# Patient Record
Sex: Female | Born: 1945 | Race: White | Hispanic: No | Marital: Married | Smoking: Never smoker
Health system: Southern US, Community
[De-identification: ages and names within clinical notes are randomized; demographics above are authoritative.]

## PROBLEM LIST (undated history)

## (undated) DIAGNOSIS — Z923 Personal history of irradiation: Secondary | ICD-10-CM

## (undated) DIAGNOSIS — M1990 Unspecified osteoarthritis, unspecified site: Secondary | ICD-10-CM

## (undated) DIAGNOSIS — I10 Essential (primary) hypertension: Secondary | ICD-10-CM

## (undated) DIAGNOSIS — A809 Acute poliomyelitis, unspecified: Secondary | ICD-10-CM

## (undated) DIAGNOSIS — G709 Myoneural disorder, unspecified: Secondary | ICD-10-CM

## (undated) DIAGNOSIS — E785 Hyperlipidemia, unspecified: Secondary | ICD-10-CM

## (undated) DIAGNOSIS — C801 Malignant (primary) neoplasm, unspecified: Secondary | ICD-10-CM

## (undated) HISTORY — PX: BREAST LUMPECTOMY: SHX2

## (undated) HISTORY — DX: Unspecified osteoarthritis, unspecified site: M19.90

## (undated) HISTORY — DX: Acute poliomyelitis, unspecified: A80.9

## (undated) HISTORY — DX: Malignant (primary) neoplasm, unspecified: C80.1

## (undated) HISTORY — DX: Myoneural disorder, unspecified: G70.9

## (undated) HISTORY — DX: Essential (primary) hypertension: I10

## (undated) HISTORY — DX: Hyperlipidemia, unspecified: E78.5

## (undated) HISTORY — PX: FOOT SURGERY: SHX648

---

## 1980-01-18 HISTORY — PX: BREAST BIOPSY: SHX20

## 1998-09-03 ENCOUNTER — Other Ambulatory Visit: Admission: RE | Admit: 1998-09-03 | Discharge: 1998-09-03 | Payer: Self-pay | Admitting: Obstetrics and Gynecology

## 1998-11-18 ENCOUNTER — Encounter: Admission: RE | Admit: 1998-11-18 | Discharge: 1998-11-18 | Payer: Self-pay | Admitting: Obstetrics and Gynecology

## 1998-11-18 ENCOUNTER — Encounter: Payer: Self-pay | Admitting: Obstetrics and Gynecology

## 1999-10-05 ENCOUNTER — Other Ambulatory Visit: Admission: RE | Admit: 1999-10-05 | Discharge: 1999-10-05 | Payer: Self-pay | Admitting: Obstetrics and Gynecology

## 1999-11-23 ENCOUNTER — Encounter: Admission: RE | Admit: 1999-11-23 | Discharge: 1999-11-23 | Payer: Self-pay | Admitting: Family Medicine

## 1999-11-23 ENCOUNTER — Encounter: Payer: Self-pay | Admitting: Family Medicine

## 2000-10-19 ENCOUNTER — Other Ambulatory Visit: Admission: RE | Admit: 2000-10-19 | Discharge: 2000-10-19 | Payer: Self-pay | Admitting: Internal Medicine

## 2000-12-01 ENCOUNTER — Encounter: Admission: RE | Admit: 2000-12-01 | Discharge: 2000-12-01 | Payer: Self-pay | Admitting: Family Medicine

## 2000-12-01 ENCOUNTER — Encounter: Payer: Self-pay | Admitting: Family Medicine

## 2002-01-03 ENCOUNTER — Encounter: Admission: RE | Admit: 2002-01-03 | Discharge: 2002-01-03 | Payer: Self-pay | Admitting: Family Medicine

## 2002-01-03 ENCOUNTER — Encounter: Payer: Self-pay | Admitting: Family Medicine

## 2002-06-21 ENCOUNTER — Ambulatory Visit (HOSPITAL_COMMUNITY): Admission: RE | Admit: 2002-06-21 | Discharge: 2002-06-21 | Payer: Self-pay | Admitting: Gastroenterology

## 2003-04-08 ENCOUNTER — Encounter: Admission: RE | Admit: 2003-04-08 | Discharge: 2003-04-08 | Payer: Self-pay | Admitting: Family Medicine

## 2004-04-27 ENCOUNTER — Encounter: Admission: RE | Admit: 2004-04-27 | Discharge: 2004-04-27 | Payer: Self-pay | Admitting: Obstetrics and Gynecology

## 2004-04-30 ENCOUNTER — Encounter: Admission: RE | Admit: 2004-04-30 | Discharge: 2004-04-30 | Payer: Self-pay | Admitting: Obstetrics and Gynecology

## 2004-07-07 ENCOUNTER — Emergency Department (HOSPITAL_COMMUNITY): Admission: EM | Admit: 2004-07-07 | Discharge: 2004-07-07 | Payer: Self-pay | Admitting: Emergency Medicine

## 2005-01-17 HISTORY — PX: BREAST SURGERY: SHX581

## 2005-07-05 ENCOUNTER — Encounter: Admission: RE | Admit: 2005-07-05 | Discharge: 2005-07-05 | Payer: Self-pay | Admitting: Family Medicine

## 2005-07-08 ENCOUNTER — Encounter (INDEPENDENT_AMBULATORY_CARE_PROVIDER_SITE_OTHER): Payer: Self-pay | Admitting: Specialist

## 2005-07-08 ENCOUNTER — Encounter: Admission: RE | Admit: 2005-07-08 | Discharge: 2005-07-08 | Payer: Self-pay | Admitting: Family Medicine

## 2005-07-08 ENCOUNTER — Encounter (INDEPENDENT_AMBULATORY_CARE_PROVIDER_SITE_OTHER): Payer: Self-pay | Admitting: Radiology

## 2005-07-17 ENCOUNTER — Encounter: Admission: RE | Admit: 2005-07-17 | Discharge: 2005-07-17 | Payer: Self-pay | Admitting: General Surgery

## 2005-08-02 ENCOUNTER — Encounter: Admission: RE | Admit: 2005-08-02 | Discharge: 2005-08-02 | Payer: Self-pay | Admitting: General Surgery

## 2005-08-03 ENCOUNTER — Ambulatory Visit (HOSPITAL_BASED_OUTPATIENT_CLINIC_OR_DEPARTMENT_OTHER): Admission: RE | Admit: 2005-08-03 | Discharge: 2005-08-03 | Payer: Self-pay | Admitting: General Surgery

## 2005-08-03 ENCOUNTER — Encounter (INDEPENDENT_AMBULATORY_CARE_PROVIDER_SITE_OTHER): Payer: Self-pay | Admitting: Specialist

## 2005-08-03 ENCOUNTER — Encounter: Admission: RE | Admit: 2005-08-03 | Discharge: 2005-08-03 | Payer: Self-pay | Admitting: General Surgery

## 2005-08-15 ENCOUNTER — Ambulatory Visit: Payer: Self-pay | Admitting: Oncology

## 2005-08-24 LAB — CBC WITH DIFFERENTIAL/PLATELET
Eosinophils Absolute: 0.3 10*3/uL (ref 0.0–0.5)
HCT: 41.1 % (ref 34.8–46.6)
HGB: 14.2 g/dL (ref 11.6–15.9)
LYMPH%: 27.5 % (ref 14.0–48.0)
MONO#: 0.6 10*3/uL (ref 0.1–0.9)
NEUT#: 2.7 10*3/uL (ref 1.5–6.5)
NEUT%: 54.7 % (ref 39.6–76.8)
Platelets: 335 10*3/uL (ref 145–400)
WBC: 5 10*3/uL (ref 3.9–10.0)

## 2005-08-24 LAB — COMPREHENSIVE METABOLIC PANEL
CO2: 29 mEq/L (ref 19–32)
Creatinine, Ser: 0.77 mg/dL (ref 0.40–1.20)
Glucose, Bld: 87 mg/dL (ref 70–99)
Sodium: 143 mEq/L (ref 135–145)
Total Bilirubin: 0.5 mg/dL (ref 0.3–1.2)
Total Protein: 7.2 g/dL (ref 6.0–8.3)

## 2005-08-24 LAB — CANCER ANTIGEN 27.29: CA 27.29: 13 U/mL (ref 0–39)

## 2005-08-24 LAB — LACTATE DEHYDROGENASE: LDH: 153 U/L (ref 94–250)

## 2005-08-30 ENCOUNTER — Ambulatory Visit: Admission: RE | Admit: 2005-08-30 | Discharge: 2005-11-08 | Payer: Self-pay | Admitting: Radiation Oncology

## 2005-09-08 ENCOUNTER — Ambulatory Visit (HOSPITAL_COMMUNITY): Admission: RE | Admit: 2005-09-08 | Discharge: 2005-09-08 | Payer: Self-pay | Admitting: Oncology

## 2005-12-07 ENCOUNTER — Ambulatory Visit: Payer: Self-pay | Admitting: Oncology

## 2005-12-12 LAB — COMPREHENSIVE METABOLIC PANEL WITH GFR
ALT: 13 U/L (ref 0–35)
AST: 17 U/L (ref 0–37)
Albumin: 4.3 g/dL (ref 3.5–5.2)
Alkaline Phosphatase: 62 U/L (ref 39–117)
BUN: 7 mg/dL (ref 6–23)
CO2: 29 meq/L (ref 19–32)
Calcium: 10.1 mg/dL (ref 8.4–10.5)
Chloride: 103 meq/L (ref 96–112)
Creatinine, Ser: 0.75 mg/dL (ref 0.40–1.20)
Glucose, Bld: 102 mg/dL — ABNORMAL HIGH (ref 70–99)
Potassium: 3.7 meq/L (ref 3.5–5.3)
Sodium: 139 meq/L (ref 135–145)
Total Bilirubin: 0.5 mg/dL (ref 0.3–1.2)
Total Protein: 7.6 g/dL (ref 6.0–8.3)

## 2005-12-12 LAB — CBC WITH DIFFERENTIAL/PLATELET
BASO%: 0.7 % (ref 0.0–2.0)
Basophils Absolute: 0 10*3/uL (ref 0.0–0.1)
HCT: 39.8 % (ref 34.8–46.6)
HGB: 13.9 g/dL (ref 11.6–15.9)
MONO#: 0.6 10*3/uL (ref 0.1–0.9)
NEUT%: 60.7 % (ref 39.6–76.8)
WBC: 4.6 10*3/uL (ref 3.9–10.0)
lymph#: 1 10*3/uL (ref 0.9–3.3)

## 2006-03-03 ENCOUNTER — Ambulatory Visit: Payer: Self-pay | Admitting: Oncology

## 2006-03-07 LAB — COMPREHENSIVE METABOLIC PANEL
ALT: 16 U/L (ref 0–35)
AST: 16 U/L (ref 0–37)
Albumin: 3.9 g/dL (ref 3.5–5.2)
BUN: 7 mg/dL (ref 6–23)
Calcium: 9.9 mg/dL (ref 8.4–10.5)
Chloride: 107 mEq/L (ref 96–112)
Potassium: 3.6 mEq/L (ref 3.5–5.3)
Sodium: 143 mEq/L (ref 135–145)
Total Protein: 7.1 g/dL (ref 6.0–8.3)

## 2006-03-07 LAB — CBC WITH DIFFERENTIAL/PLATELET
BASO%: 0.8 % (ref 0.0–2.0)
Basophils Absolute: 0 10*3/uL (ref 0.0–0.1)
EOS%: 3.4 % (ref 0.0–7.0)
HGB: 13.5 g/dL (ref 11.6–15.9)
MCH: 32.4 pg (ref 26.0–34.0)
RBC: 4.18 10*6/uL (ref 3.70–5.32)
RDW: 12.1 % (ref 11.3–14.5)
lymph#: 1.4 10*3/uL (ref 0.9–3.3)

## 2006-03-07 LAB — LACTATE DEHYDROGENASE: LDH: 160 U/L (ref 94–250)

## 2006-07-03 ENCOUNTER — Ambulatory Visit: Payer: Self-pay | Admitting: Oncology

## 2006-07-06 LAB — CBC WITH DIFFERENTIAL/PLATELET
Eosinophils Absolute: 0.2 10*3/uL (ref 0.0–0.5)
HGB: 13.8 g/dL (ref 11.6–15.9)
MONO#: 0.9 10*3/uL (ref 0.1–0.9)
NEUT#: 3.3 10*3/uL (ref 1.5–6.5)
Platelets: 309 10*3/uL (ref 145–400)
RBC: 4.22 10*6/uL (ref 3.70–5.32)
RDW: 12.3 % (ref 11.3–14.5)
WBC: 5.8 10*3/uL (ref 3.9–10.0)

## 2006-07-06 LAB — COMPREHENSIVE METABOLIC PANEL
Albumin: 4.1 g/dL (ref 3.5–5.2)
CO2: 25 mEq/L (ref 19–32)
Calcium: 9.6 mg/dL (ref 8.4–10.5)
Glucose, Bld: 75 mg/dL (ref 70–99)
Potassium: 4 mEq/L (ref 3.5–5.3)
Sodium: 142 mEq/L (ref 135–145)
Total Protein: 7.3 g/dL (ref 6.0–8.3)

## 2006-07-06 LAB — CANCER ANTIGEN 27.29: CA 27.29: 10 U/mL (ref 0–39)

## 2006-07-06 LAB — LACTATE DEHYDROGENASE: LDH: 179 U/L (ref 94–250)

## 2006-07-11 ENCOUNTER — Encounter: Admission: RE | Admit: 2006-07-11 | Discharge: 2006-07-11 | Payer: Self-pay | Admitting: Oncology

## 2006-10-24 ENCOUNTER — Ambulatory Visit: Payer: Self-pay | Admitting: Oncology

## 2006-10-27 LAB — COMPREHENSIVE METABOLIC PANEL
AST: 15 U/L (ref 0–37)
Albumin: 4 g/dL (ref 3.5–5.2)
Alkaline Phosphatase: 59 U/L (ref 39–117)
BUN: 9 mg/dL (ref 6–23)
Calcium: 9.9 mg/dL (ref 8.4–10.5)
Chloride: 105 mEq/L (ref 96–112)
Glucose, Bld: 80 mg/dL (ref 70–99)
Potassium: 4 mEq/L (ref 3.5–5.3)
Sodium: 143 mEq/L (ref 135–145)
Total Protein: 7.3 g/dL (ref 6.0–8.3)

## 2006-10-27 LAB — CBC WITH DIFFERENTIAL/PLATELET
Basophils Absolute: 0 10*3/uL (ref 0.0–0.1)
EOS%: 5.9 % (ref 0.0–7.0)
Eosinophils Absolute: 0.3 10*3/uL (ref 0.0–0.5)
HGB: 13.8 g/dL (ref 11.6–15.9)
MONO%: 14.3 % — ABNORMAL HIGH (ref 0.0–13.0)
NEUT#: 3 10*3/uL (ref 1.5–6.5)
RBC: 4.19 10*6/uL (ref 3.70–5.32)
RDW: 12.5 % (ref 11.3–14.5)
lymph#: 1.1 10*3/uL (ref 0.9–3.3)

## 2007-03-07 ENCOUNTER — Ambulatory Visit: Payer: Self-pay | Admitting: Oncology

## 2007-03-09 LAB — CBC WITH DIFFERENTIAL/PLATELET
Eosinophils Absolute: 0.2 10*3/uL (ref 0.0–0.5)
HCT: 38.6 % (ref 34.8–46.6)
HGB: 13.5 g/dL (ref 11.6–15.9)
LYMPH%: 23.7 % (ref 14.0–48.0)
MONO#: 0.6 10*3/uL (ref 0.1–0.9)
NEUT#: 3 10*3/uL (ref 1.5–6.5)
Platelets: 299 10*3/uL (ref 145–400)
RBC: 4.2 10*6/uL (ref 3.70–5.32)
WBC: 5 10*3/uL (ref 3.9–10.0)

## 2007-03-12 LAB — COMPREHENSIVE METABOLIC PANEL
Albumin: 3.9 g/dL (ref 3.5–5.2)
CO2: 26 mEq/L (ref 19–32)
Glucose, Bld: 96 mg/dL (ref 70–99)
Sodium: 143 mEq/L (ref 135–145)
Total Bilirubin: 0.5 mg/dL (ref 0.3–1.2)
Total Protein: 7.1 g/dL (ref 6.0–8.3)

## 2007-03-12 LAB — LACTATE DEHYDROGENASE: LDH: 161 U/L (ref 94–250)

## 2007-03-12 LAB — VITAMIN D 1,25 DIHYDROXY: Vit D, 1,25-Dihydroxy: 35 pg/mL (ref 6–62)

## 2007-07-31 ENCOUNTER — Encounter: Admission: RE | Admit: 2007-07-31 | Discharge: 2007-07-31 | Payer: Self-pay | Admitting: Oncology

## 2007-08-22 ENCOUNTER — Encounter: Admission: RE | Admit: 2007-08-22 | Discharge: 2007-08-22 | Payer: Self-pay | Admitting: Oncology

## 2008-05-09 ENCOUNTER — Ambulatory Visit: Payer: Self-pay | Admitting: Oncology

## 2008-05-13 LAB — CBC WITH DIFFERENTIAL/PLATELET
BASO%: 0.5 % (ref 0.0–2.0)
EOS%: 3 % (ref 0.0–7.0)
LYMPH%: 19.2 % (ref 14.0–49.7)
MCH: 32.4 pg (ref 25.1–34.0)
MCHC: 34.7 g/dL (ref 31.5–36.0)
MCV: 93.5 fL (ref 79.5–101.0)
MONO%: 11.5 % (ref 0.0–14.0)
Platelets: 292 10*3/uL (ref 145–400)
RBC: 4.3 10*6/uL (ref 3.70–5.45)
WBC: 6.1 10*3/uL (ref 3.9–10.3)

## 2008-05-14 LAB — LACTATE DEHYDROGENASE: LDH: 167 U/L (ref 94–250)

## 2008-05-14 LAB — COMPREHENSIVE METABOLIC PANEL
ALT: 13 U/L (ref 0–35)
Alkaline Phosphatase: 59 U/L (ref 39–117)
Sodium: 142 mEq/L (ref 135–145)
Total Bilirubin: 0.4 mg/dL (ref 0.3–1.2)
Total Protein: 7.2 g/dL (ref 6.0–8.3)

## 2008-08-05 ENCOUNTER — Encounter: Admission: RE | Admit: 2008-08-05 | Discharge: 2008-08-05 | Payer: Self-pay | Admitting: Oncology

## 2009-05-11 ENCOUNTER — Ambulatory Visit: Payer: Self-pay | Admitting: Oncology

## 2009-05-12 LAB — CBC WITH DIFFERENTIAL/PLATELET
BASO%: 0.7 % (ref 0.0–2.0)
EOS%: 4.8 % (ref 0.0–7.0)
Eosinophils Absolute: 0.3 10*3/uL (ref 0.0–0.5)
HCT: 39.9 % (ref 34.8–46.6)
MCH: 33.8 pg (ref 25.1–34.0)
MONO#: 0.7 10*3/uL (ref 0.1–0.9)
MONO%: 11.6 % (ref 0.0–14.0)
WBC: 5.9 10*3/uL (ref 3.9–10.3)

## 2009-05-12 LAB — COMPREHENSIVE METABOLIC PANEL
Alkaline Phosphatase: 53 U/L (ref 39–117)
CO2: 31 mEq/L (ref 19–32)
Chloride: 105 mEq/L (ref 96–112)
Potassium: 3.6 mEq/L (ref 3.5–5.3)
Sodium: 141 mEq/L (ref 135–145)
Total Protein: 7.4 g/dL (ref 6.0–8.3)

## 2009-05-12 LAB — CANCER ANTIGEN 27.29: CA 27.29: 11 U/mL (ref 0–39)

## 2009-05-12 LAB — LACTATE DEHYDROGENASE: LDH: 178 U/L (ref 94–250)

## 2009-08-06 ENCOUNTER — Encounter: Admission: RE | Admit: 2009-08-06 | Discharge: 2009-08-06 | Payer: Self-pay | Admitting: Oncology

## 2010-02-07 ENCOUNTER — Encounter: Payer: Self-pay | Admitting: Family Medicine

## 2010-05-13 ENCOUNTER — Encounter (HOSPITAL_BASED_OUTPATIENT_CLINIC_OR_DEPARTMENT_OTHER): Payer: BC Managed Care – PPO | Admitting: Oncology

## 2010-05-13 ENCOUNTER — Other Ambulatory Visit: Payer: Self-pay | Admitting: Oncology

## 2010-05-13 DIAGNOSIS — C50319 Malignant neoplasm of lower-inner quadrant of unspecified female breast: Secondary | ICD-10-CM

## 2010-05-13 LAB — CBC WITH DIFFERENTIAL/PLATELET
Basophils Absolute: 0 10*3/uL (ref 0.0–0.1)
HGB: 14.2 g/dL (ref 11.6–15.9)
LYMPH%: 23.9 % (ref 14.0–49.7)
MCHC: 34.4 g/dL (ref 31.5–36.0)
MCV: 94.2 fL (ref 79.5–101.0)
MONO#: 0.7 10*3/uL (ref 0.1–0.9)
MONO%: 13.9 % (ref 0.0–14.0)
NEUT#: 2.8 10*3/uL (ref 1.5–6.5)
NEUT%: 57.4 % (ref 38.4–76.8)
RBC: 4.38 10*6/uL (ref 3.70–5.45)
lymph#: 1.1 10*3/uL (ref 0.9–3.3)

## 2010-05-14 LAB — COMPREHENSIVE METABOLIC PANEL
ALT: 15 U/L (ref 0–35)
AST: 19 U/L (ref 0–37)
Albumin: 4.1 g/dL (ref 3.5–5.2)
Calcium: 10.1 mg/dL (ref 8.4–10.5)
Potassium: 3.9 mEq/L (ref 3.5–5.3)
Total Protein: 7.2 g/dL (ref 6.0–8.3)

## 2010-05-20 ENCOUNTER — Encounter: Payer: BC Managed Care – PPO | Admitting: Oncology

## 2010-05-20 ENCOUNTER — Other Ambulatory Visit: Payer: Self-pay | Admitting: Oncology

## 2010-05-20 DIAGNOSIS — Z9889 Other specified postprocedural states: Secondary | ICD-10-CM

## 2010-06-04 NOTE — Op Note (Signed)
NAMEMARLETA, Ashley                 ACCOUNT NO.:  [id number]   MEDICAL RECORD NO.:  [id number]          PATIENT TYPE:  AMB   LOCATION:  DSC                          FACILITY:  [facility]   PHYSICIAN:  Angelia Mould. Derrell Lolling, M.D.DATE OF BIRTH:  1945/08/21   DATE OF PROCEDURE:  08/03/2005  DATE OF DISCHARGE:                                 OPERATIVE REPORT   PREOPERATIVE DIAGNOSIS:  Carcinoma right breast.   POSTOPERATIVE DIAGNOSIS:  Carcinoma right breast.   OPERATION PERFORMED:  1.  Injection of blue dye, right breast.  2.  Right partial mastectomy with needle localization and specimen      mammogram.  3.  Right axillary sentinel lymph node mapping and biopsy.   SURGEON:  Angelia Mould. Derrell Lolling, M.D.   OPERATIVE INDICATIONS:  This is a 65 year old white female who had routine  mammograms recently with findings of a 6-8 mm mass in the right breast at  the 5 o'clock position.  This area was core biopsied and showed invasive  mammary carcinoma.  She is estrogen and progesterone receptor positive and  negative for HER-2/neu.  Her MRI shows this to be a solitary lesion.  She  was interested in breast conservation.  She is brought to the operating room  electively for a right partial mastectomy and sentinel node biopsy.   OPERATIVE TECHNIQUE:  The patient underwent needle localization at the  [facility] and was then brought to [facility].  While in the preoperative area, 1 mL of technetium sulfur colloid was  injected intradermally into the right breast by the nuclear medicine  technician.  The patient was taken to the operating room where she underwent  general LMA anesthesia.  The periareolar area of the right breast was then  cleansed with alcohol and 5 mL of blue dye, consisting of 2 mL of methylene  blue and 3 mL of saline was injected in the subareolar area and the breast  was massaged for 5 minutes.   The right breast and axilla were then prepped and draped in  a sterile  fashion.  Intravenous antibiotics were given.  The patient was identified.  0.5% Marcaine (plain) was used as a local infiltration anesthetic.   The guidewire was inserted in the right breast very far medially and  directed laterally.  A radially oriented generally transverse incision was  made from the margin of the areola out to the wire.  Dissection was carried  down through the subcutaneous tissue into the breast tissue.  Using  electrocautery, traction and counter traction, we dissected all the way  around the localizing wire.  I took the dissection down to the chest wall in  one area.  I did not feel or see any tumor.  I marked the specimen with silk  sutures to orient the radiologist and the pathologist.  Specimen mammogram  was performed and it was read as having a good excision around the tumor.  The breast incision was irrigated with saline.  Hemostasis was excellent and  achieved with electrocautery.  The deeper breast tissues were closed  with  interrupted sutures of 3-0 Vicryl and the skin closed with a running  subcuticular suture of 4-0 Monocryl and Steri-Strips.   We used the NeoProbe to isolate the sentinel node and found one area that  was very hot.  We then made a transverse skin crease incision in the right  axilla.  Dissection was carried down through the subcutaneous tissue.  Using  the NeoProbe at all times, we isolated an extremely hot and blue node.  This  was probably in the level 1 nodes, it was excised, it was sent to the lab  and the imprint cytology of the sentinel node was negative for cancer cells.   We used a NeoProbe to examine the axilla further.  The axilla at this point  was completely cold and there were no other sentinel nodes to identify. The  axillary wound was irrigated with saline.  The deep subcutaneous tissue was  closed with interrupted sutures of 3-0 Vicryl and the skin closed with a  running subcuticular suture of 4-0 Monocryl and  Steri-Strips.  Clean  bandages were placed and the patient taken to the recovery room in stable  condition.  Estimated blood loss was about 20-25 mL.  Complications none.  Sponge, needle and instrument counts were correct.      Angelia Mould. Derrell Lolling, M.D.  Electronically Signed     HMI/MEDQ  D:  08/03/2005  T:  08/03/2005  Job:  [id number]   cc:   Daryl Eastern, M.D.  Fax: [phone]   Donia Guiles, M.D.  Fax: [phone]

## 2010-06-24 ENCOUNTER — Encounter (INDEPENDENT_AMBULATORY_CARE_PROVIDER_SITE_OTHER): Payer: Self-pay | Admitting: General Surgery

## 2010-08-10 ENCOUNTER — Ambulatory Visit
Admission: RE | Admit: 2010-08-10 | Discharge: 2010-08-10 | Disposition: A | Discharge status: Home / Self Care | Payer: BC Managed Care – PPO | Source: Ambulatory Visit | Attending: Oncology | Admitting: Oncology

## 2010-08-10 DIAGNOSIS — Z9889 Other specified postprocedural states: Secondary | ICD-10-CM

## 2010-11-02 ENCOUNTER — Ambulatory Visit (INDEPENDENT_AMBULATORY_CARE_PROVIDER_SITE_OTHER): Payer: Self-pay | Admitting: General Surgery

## 2010-11-08 ENCOUNTER — Encounter (INDEPENDENT_AMBULATORY_CARE_PROVIDER_SITE_OTHER): Payer: Self-pay | Admitting: General Surgery

## 2010-11-08 ENCOUNTER — Ambulatory Visit (INDEPENDENT_AMBULATORY_CARE_PROVIDER_SITE_OTHER): Payer: BC Managed Care – PPO | Admitting: General Surgery

## 2010-11-08 VITALS — BP 138/92 | HR 72 | Temp 98.1°F | Resp 16 | Ht 61.0 in | Wt 181.6 lb

## 2010-11-08 DIAGNOSIS — Z853 Personal history of malignant neoplasm of breast: Secondary | ICD-10-CM

## 2010-11-08 NOTE — Patient Instructions (Signed)
Your breast exam is normal today. There is no sign of cancer. Your mammograms in July looked normal. We' do not see any evidence of malignancy. Continue regular followups with Dr. Donnie Coffin. I will see you back in one year after you gets her annual mammograms.

## 2010-11-08 NOTE — Progress Notes (Signed)
Subjective:     Patient ID: Julie Ashley, female   DOB: 1945-08-30, 65 y.o.   MRN: [id number]  HPI  This 65 year old woman returns to see me in followup for her breast cancer.  On August 03, 2005 she underwent right partial mastectomy and sentinel node biopsy. This was pathologic stage TI B., N0, receptor positive, HER-2-negative. She is on anastrozole and is finishing up her five-year course. She has no known recurrence to date.  She has no complaints about her breast  Mammogram performed on August 10, 2010. He shows some benign calcifications in the lumpectomy site, category 2, no sign of malignancy. Review of Systems  Constitutional: Negative for fever, chills and unexpected weight change.  HENT: Negative for hearing loss, congestion, sore throat, trouble swallowing and voice change.   Eyes: Negative for visual disturbance.  Respiratory: Negative for cough and wheezing.   Cardiovascular: Negative for chest pain, palpitations and leg swelling.  Gastrointestinal: Negative for nausea, vomiting, abdominal pain, diarrhea, constipation, blood in stool, abdominal distention and anal bleeding.  Genitourinary: Negative for hematuria, vaginal bleeding and difficulty urinating.  Musculoskeletal: Negative for arthralgias.  Skin: Negative for rash and wound.  Neurological: Negative for seizures, syncope and headaches.  Hematological: Negative for adenopathy. Does not bruise/bleed easily.  Psychiatric/Behavioral: Negative for confusion.       Objective:   Physical Exam  Constitutional: She is oriented to person, place, and time. She appears well-developed and well-nourished.  HENT:  Head: Normocephalic and atraumatic.  Nose: Nose normal.  Mouth/Throat: No oropharyngeal exudate.  Eyes: Conjunctivae and EOM are normal.  Neck: Neck supple. No JVD present. No tracheal deviation present. No thyromegaly present.  Cardiovascular: Normal rate, regular rhythm, normal heart sounds and intact distal  pulses.   No murmur heard. Pulmonary/Chest: Effort normal and breath sounds normal. No respiratory distress. She has no wheezes. She has no rales. She exhibits no tenderness.    Lymphadenopathy:    She has no cervical adenopathy.  Neurological: She is alert and oriented to person, place, and time. She exhibits normal muscle tone. Coordination normal.  Skin: Skin is warm. No rash noted. No erythema. No pallor.  Psychiatric: She has a normal mood and affect. Her behavior is normal. Judgment and thought content normal.       Assessment:     Invasive carcinoma right breast, stage TI B. N0, receptor positive, HER-2 negative. No evidence of recurrence 5 years following breast conservation surgery, radiation  therapy and antiestrogen therapy.    Plan:     Continue followup with Dr. Pierce Crane. He will decide when to take her off of anastrozole.  Return to see me in one year after she gets her annual mammograms.

## 2010-12-18 ENCOUNTER — Telehealth: Payer: Self-pay | Admitting: Oncology

## 2010-12-18 NOTE — Telephone Encounter (Signed)
per pof 05/03 called pts home lmovm with appts for [id number].  asked pt to rtn call to confirm appt

## 2010-12-20 ENCOUNTER — Telehealth: Payer: Self-pay | Admitting: Oncology

## 2010-12-20 ENCOUNTER — Telehealth: Payer: Self-pay | Admitting: *Deleted

## 2010-12-20 NOTE — Telephone Encounter (Signed)
pt rtn call and r/s appts to 06/07/2011

## 2010-12-20 NOTE — Telephone Encounter (Signed)
Pt. Called.  Her 5 years on arimidex is up the end of December.  However her 90 days supply goes into the first of the year.  Should she just continue and finish her 90 days supply.  Yes, that is fine per Dr. Donnie Coffin.  Pt. Appreciated the call back.

## 2011-03-02 ENCOUNTER — Telehealth: Payer: Self-pay | Admitting: *Deleted

## 2011-03-02 NOTE — Telephone Encounter (Signed)
Pt. Called to see if she will need to taper her arimidex when it is time to stop it.  No,  She can just stop it when the time comes.

## 2011-05-11 DIAGNOSIS — Z0000 Encounter for general adult medical examination without abnormal findings: Secondary | ICD-10-CM | POA: Diagnosis not present

## 2011-05-11 DIAGNOSIS — Z124 Encounter for screening for malignant neoplasm of cervix: Secondary | ICD-10-CM | POA: Diagnosis not present

## 2011-05-30 ENCOUNTER — Other Ambulatory Visit: Payer: Self-pay | Admitting: Physician Assistant

## 2011-05-30 DIAGNOSIS — C50919 Malignant neoplasm of unspecified site of unspecified female breast: Secondary | ICD-10-CM

## 2011-05-31 ENCOUNTER — Other Ambulatory Visit (HOSPITAL_BASED_OUTPATIENT_CLINIC_OR_DEPARTMENT_OTHER): Payer: Medicare Other | Admitting: Lab

## 2011-05-31 DIAGNOSIS — C50919 Malignant neoplasm of unspecified site of unspecified female breast: Secondary | ICD-10-CM | POA: Diagnosis not present

## 2011-05-31 LAB — CBC WITH DIFFERENTIAL/PLATELET
BASO%: 0.9 % (ref 0.0–2.0)
Basophils Absolute: 0 10e3/uL (ref 0.0–0.1)
EOS%: 7.5 % — ABNORMAL HIGH (ref 0.0–7.0)
Eosinophils Absolute: 0.4 10e3/uL (ref 0.0–0.5)
HCT: 40.6 % (ref 34.8–46.6)
HGB: 14 g/dL (ref 11.6–15.9)
LYMPH%: 27.6 % (ref 14.0–49.7)
MCH: 32.5 pg (ref 25.1–34.0)
MCHC: 34.5 g/dL (ref 31.5–36.0)
MCV: 94.4 fL (ref 79.5–101.0)
MONO#: 0.6 10e3/uL (ref 0.1–0.9)
MONO%: 10.9 % (ref 0.0–14.0)
NEUT#: 2.9 10e3/uL (ref 1.5–6.5)
NEUT%: 53.1 % (ref 38.4–76.8)
Platelets: 234 10e3/uL (ref 145–400)
RBC: 4.3 10e6/uL (ref 3.70–5.45)
RDW: 12.6 % (ref 11.2–14.5)
WBC: 5.5 10e3/uL (ref 3.9–10.3)
lymph#: 1.5 10e3/uL (ref 0.9–3.3)
nRBC: 0 % (ref 0–0)

## 2011-05-31 LAB — COMPREHENSIVE METABOLIC PANEL WITH GFR
ALT: 15 U/L (ref 0–35)
AST: 19 U/L (ref 0–37)
Albumin: 3.6 g/dL (ref 3.5–5.2)
Alkaline Phosphatase: 68 U/L (ref 39–117)
BUN: 9 mg/dL (ref 6–23)
CO2: 26 meq/L (ref 19–32)
Calcium: 9.4 mg/dL (ref 8.4–10.5)
Chloride: 106 meq/L (ref 96–112)
Creatinine, Ser: 0.75 mg/dL (ref 0.50–1.10)
Glucose, Bld: 116 mg/dL — ABNORMAL HIGH (ref 70–99)
Potassium: 3.5 meq/L (ref 3.5–5.3)
Sodium: 141 meq/L (ref 135–145)
Total Bilirubin: 0.3 mg/dL (ref 0.3–1.2)
Total Protein: 7.5 g/dL (ref 6.0–8.3)

## 2011-05-31 LAB — CANCER ANTIGEN 27.29: CA 27.29: 10 U/mL (ref 0–39)

## 2011-06-07 ENCOUNTER — Ambulatory Visit: Payer: BC Managed Care – PPO | Admitting: Oncology

## 2011-06-07 ENCOUNTER — Other Ambulatory Visit: Payer: BC Managed Care – PPO

## 2011-06-08 ENCOUNTER — Other Ambulatory Visit: Payer: BC Managed Care – PPO

## 2011-06-08 ENCOUNTER — Ambulatory Visit: Payer: BC Managed Care – PPO | Admitting: Oncology

## 2011-07-05 ENCOUNTER — Other Ambulatory Visit: Payer: Self-pay | Admitting: Oncology

## 2011-07-05 DIAGNOSIS — Z853 Personal history of malignant neoplasm of breast: Secondary | ICD-10-CM

## 2011-07-27 ENCOUNTER — Telehealth: Payer: Self-pay | Admitting: *Deleted

## 2011-07-27 ENCOUNTER — Ambulatory Visit (HOSPITAL_BASED_OUTPATIENT_CLINIC_OR_DEPARTMENT_OTHER): Payer: Medicare Other | Admitting: Oncology

## 2011-07-27 VITALS — BP 147/89 | HR 88 | Temp 98.2°F | Ht 61.0 in | Wt 186.8 lb

## 2011-07-27 DIAGNOSIS — Z170 Estrogen receptor positive status [ER+]: Secondary | ICD-10-CM

## 2011-07-27 DIAGNOSIS — E559 Vitamin D deficiency, unspecified: Secondary | ICD-10-CM

## 2011-07-27 DIAGNOSIS — C50919 Malignant neoplasm of unspecified site of unspecified female breast: Secondary | ICD-10-CM

## 2011-07-27 DIAGNOSIS — C50319 Malignant neoplasm of lower-inner quadrant of unspecified female breast: Secondary | ICD-10-CM

## 2011-07-27 NOTE — Telephone Encounter (Signed)
gave patient appointment for 2014 printed out calendar and gave to the patient bone density breast center

## 2011-07-27 NOTE — Progress Notes (Signed)
Hematology and Oncology Follow Up Visit  GISSELLE GALVIS [id number] 1945-02-14 66 y.o. 07/27/2011 4:22 PM   DIAGNOSIS:   Encounter Diagnoses  Name Primary?  . Malignant neoplasm of breast (female), unspecified site Yes  . Unspecified vitamin D deficiency      PAST THERAPY: Right lumpectomy sentinel lymph node evaluation 08/03/2005, stage IB N0, HER-2 negative status post 5 years of Arimidex.    Interim History:  She is been doing well. No current complaints. Appetite good weight is stable. She saw a Careers adviser in the fall. She is due for followup mammogram this month.  Medications: I have reviewed the patient's current medications.  Allergies:  Allergies  Allergen Reactions  . Procaine Hcl   . Sulfur     Past Medical History, Surgical history, Social history, and Family History were reviewed and updated.  Review of Systems: Constitutional:  Negative for fever, chills, night sweats, anorexia, weight loss, pain. Cardiovascular: negative Respiratory: negative Neurological: negative Dermatological: negative ENT: negative Skin Gastrointestinal: negative Genito-Urinary: negative Hematological and Lymphatic: negative Breast: negative Musculoskeletal: negative Remaining ROS negative.  Physical Exam:  Blood pressure 147/89, pulse 88, temperature 98.2 F (36.8 C), temperature source Oral, height 5\' 1"  (1.549 m), weight 186 lb 12.8 oz (84.732 kg).  ECOG: 0 HEENT:  Sclerae anicteric, conjunctivae pink.  Oropharynx clear.  No mucositis or candidiasis.  Nodes:  No cervical, supraclavicular, or axillary lymphadenopathy palpated.  Breast Exam:  Right breast is benign.  No masses, discharge, skin change, or nipple inversion.  Left breast is benign.  No masses, discharge, skin change, or nipple inversion..  Lungs:  Clear to auscultation bilaterally.  No crackles, rhonchi, or wheezes.  Heart:  Regular rate and rhythm.  Abdomen:  Soft, nontender.  Positive bowel sounds.  No organomegaly or  masses palpated.  Musculoskeletal:  No focal spinal tenderness to palpation.  Extremities:  Benign.  No peripheral edema or cyanosis.  Skin:  Benign.  Neuro:  Nonfocal.      Lab Results: Lab Results  Component Value Date   WBC 5.5 05/31/2011   HGB 14.0 05/31/2011   HCT 40.6 05/31/2011   MCV 94.4 05/31/2011   PLT 234 05/31/2011     Chemistry      Component Value Date/Time   NA 141 05/31/2011 1518   K 3.5 05/31/2011 1518   CL 106 05/31/2011 1518   CO2 26 05/31/2011 1518   BUN 9 05/31/2011 1518   CREATININE 0.75 05/31/2011 1518      Component Value Date/Time   CALCIUM 9.4 05/31/2011 1518   ALKPHOS 68 05/31/2011 1518   AST 19 05/31/2011 1518   ALT 15 05/31/2011 1518   BILITOT 0.3 05/31/2011 1518       Radiological Studies:  No results found.   IMPRESSIONS AND PLAN: A 66 y.o. female with   History of node-negative ER/PR positive breast cancer status post lumpectomy, radiation and 5 years of hormonal therapy. She is doing well and is free of any obvious recurrence. Up until this year an annual basis with appropriate imaging studies. He went to her MR S. Scannell and discussed her concerns.  Spent more than half the time coordinating care, as well as discussion of BMI and its implications.      Eriko Economos 7/10/20134:22 PM Cell [phone]

## 2011-07-27 NOTE — Patient Instructions (Addendum)
pls take 2000 units of vitamin D3 daily 

## 2011-07-27 NOTE — Telephone Encounter (Signed)
Gave patient appointment for 07-2012 with lab one week before md appointment

## 2011-08-16 ENCOUNTER — Ambulatory Visit
Admission: RE | Admit: 2011-08-16 | Discharge: 2011-08-16 | Disposition: A | Discharge status: Home / Self Care | Payer: Medicare Other | Source: Ambulatory Visit | Attending: Oncology | Admitting: Oncology

## 2011-08-16 DIAGNOSIS — Z853 Personal history of malignant neoplasm of breast: Secondary | ICD-10-CM | POA: Diagnosis not present

## 2011-08-16 DIAGNOSIS — R928 Other abnormal and inconclusive findings on diagnostic imaging of breast: Secondary | ICD-10-CM | POA: Diagnosis not present

## 2011-10-11 ENCOUNTER — Ambulatory Visit (INDEPENDENT_AMBULATORY_CARE_PROVIDER_SITE_OTHER): Payer: Self-pay | Admitting: General Surgery

## 2011-10-13 DIAGNOSIS — Z23 Encounter for immunization: Secondary | ICD-10-CM | POA: Diagnosis not present

## 2011-11-09 DIAGNOSIS — Z0000 Encounter for general adult medical examination without abnormal findings: Secondary | ICD-10-CM | POA: Diagnosis not present

## 2011-11-09 DIAGNOSIS — Z8612 Personal history of poliomyelitis: Secondary | ICD-10-CM | POA: Diagnosis not present

## 2011-11-09 DIAGNOSIS — E782 Mixed hyperlipidemia: Secondary | ICD-10-CM | POA: Diagnosis not present

## 2011-11-09 DIAGNOSIS — Z1211 Encounter for screening for malignant neoplasm of colon: Secondary | ICD-10-CM | POA: Diagnosis not present

## 2011-11-09 DIAGNOSIS — L989 Disorder of the skin and subcutaneous tissue, unspecified: Secondary | ICD-10-CM | POA: Diagnosis not present

## 2011-11-09 DIAGNOSIS — Z1331 Encounter for screening for depression: Secondary | ICD-10-CM | POA: Diagnosis not present

## 2011-11-09 DIAGNOSIS — Z23 Encounter for immunization: Secondary | ICD-10-CM | POA: Diagnosis not present

## 2011-11-09 DIAGNOSIS — I10 Essential (primary) hypertension: Secondary | ICD-10-CM | POA: Diagnosis not present

## 2011-11-15 ENCOUNTER — Ambulatory Visit (INDEPENDENT_AMBULATORY_CARE_PROVIDER_SITE_OTHER): Payer: Self-pay | Admitting: General Surgery

## 2011-11-28 ENCOUNTER — Ambulatory Visit (INDEPENDENT_AMBULATORY_CARE_PROVIDER_SITE_OTHER): Payer: Medicare Other | Admitting: General Surgery

## 2011-11-28 ENCOUNTER — Encounter (INDEPENDENT_AMBULATORY_CARE_PROVIDER_SITE_OTHER): Payer: Self-pay | Admitting: General Surgery

## 2011-11-28 VITALS — BP 142/80 | HR 74 | Temp 97.6°F | Resp 16 | Ht 61.0 in | Wt 185.4 lb

## 2011-11-28 DIAGNOSIS — C50319 Malignant neoplasm of lower-inner quadrant of unspecified female breast: Secondary | ICD-10-CM | POA: Diagnosis not present

## 2011-11-28 DIAGNOSIS — C50311 Malignant neoplasm of lower-inner quadrant of right female breast: Secondary | ICD-10-CM | POA: Insufficient documentation

## 2011-11-28 NOTE — Patient Instructions (Signed)
Your breast exam is normal. I do not find any evidence of cancer.  Your mammograms in July of this year are also normal.  We have talked about reconstructive surgery to achieve symmetry. That is a good option for use if you desire. Please call Dr. Derrell Lolling if you decide you want a referral to a plastic surgeon.  Repeat mammograms in 1 year  Return to see Dr. Derrell Lolling in one year.

## 2011-11-28 NOTE — Progress Notes (Signed)
Patient ID: Julie Ashley, female   DOB: [birth year], 66 y.o.   MRN: [id number]  Chief Complaint  Patient presents with  . Breast Cancer Long Term Follow Up    HPI Julie Ashley is a 66 y.o. female.  She returns for long-term followup of her right breast cancer.  On 07/04/2005 this patient underwent right partial mastectomy and sentinel lymph node biopsy for invasive cancer of the right breast at the 5:00 position, lower inner quadrant. Pathology was T1b., N0, receptor positive, HER-2-negative. She had adjuvant radiation therapy and was on anastrozole for 5 years which is now discontinued.  She has no new complaints about her breast. She notices that the right breast is a little bit smaller than the left but says that her clothes and bra fit well and that her family cannot tell this but she can. Last mammogram 08/16/2011 look good. No focal abnormality. Category 2.  She is alternating appointments with Dr. Caron Presume and me every 6 months. She states she would like to continue that. HPI  Past Medical History  Diagnosis Date  . 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 Cancer     rt breast    Past Surgical History  Procedure Date  . Foot surgery 1952/1953/1960  . Breast biopsy 1982  . Breast surgery 2007    rt lumpectomy    History reviewed. No pertinent family history.  Social History History  Substance Use Topics  . Smoking status: Never Smoker   . Smokeless tobacco: Not on file  . Alcohol Use: No    Allergies  Allergen Reactions  . Procaine Hcl Palpitations  . Sulfur Rash    Current Outpatient Prescriptions  Medication Sig Dispense Refill  . amitriptyline (ELAVIL) 10 MG tablet Take 10 mg by mouth at bedtime.        Marland Kitchen atenolol (TENORMIN) 100 MG tablet Take 100 mg by mouth daily.        Marland Kitchen atorvastatin (LIPITOR) 10 MG tablet Take 10 mg by mouth daily.        . Calcium Carbonate-Vitamin D (CALTRATE 600+D PO) Take by mouth.        Marland Kitchen lisinopril (PRINIVIL,ZESTRIL) 20  MG tablet Take 20 mg by mouth daily.        . meloxicam (MOBIC) 15 MG tablet Take 15 mg by mouth every other day.       . Multiple Vitamins-Calcium (ONE-A-DAY WOMENS PO) Take by mouth.          Review of Systems Review of Systems  Constitutional: Negative for fever, chills and unexpected weight change.  HENT: Negative for hearing loss, congestion, sore throat, trouble swallowing and voice change.   Eyes: Negative for visual disturbance.  Respiratory: Negative for cough and wheezing.   Cardiovascular: Negative for chest pain, palpitations and leg swelling.  Gastrointestinal: Negative for nausea, vomiting, abdominal pain, diarrhea, constipation, blood in stool, abdominal distention and anal bleeding.  Genitourinary: Negative for hematuria, vaginal bleeding and difficulty urinating.  Musculoskeletal: Negative for arthralgias.  Skin: Negative for rash and wound.  Neurological: Negative for seizures, syncope and headaches.  Hematological: Negative for adenopathy. Does not bruise/bleed easily.  Psychiatric/Behavioral: Negative for confusion.    Blood pressure 142/80, pulse 74, temperature 97.6 F (36.4 C), temperature source Temporal, resp. rate 16, height 5\' 1"  (1.549 m), weight 185 lb 6 oz (84.086 kg).  Physical Exam Physical Exam  Constitutional: She is oriented to person, place, and time. She appears  well-developed and well-nourished. No distress.  HENT:  Head: Normocephalic and atraumatic.  Eyes: Conjunctivae normal and EOM are normal. Pupils are equal, round, and reactive to light. Left eye exhibits no discharge. No scleral icterus.  Neck: Neck supple. No JVD present. No tracheal deviation present. No thyromegaly present.  Cardiovascular: Normal rate, regular rhythm, normal heart sounds and intact distal pulses.   No murmur heard. Pulmonary/Chest: Effort normal and breath sounds normal. No respiratory distress. She has no wheezes. She has no rales. She exhibits no tenderness.          Right breast 10% smaller than left. Well-healed scars right axilla and right breast lower inner quadrant. No palpable mass. No axillary adenopathy on either side.  Abdominal: Soft.  Musculoskeletal: She exhibits no edema and no tenderness.  Lymphadenopathy:    She has no cervical adenopathy.  Neurological: She is alert and oriented to person, place, and time. She exhibits normal muscle tone. Coordination normal.  Skin: Skin is warm. No rash noted. She is not diaphoretic. No erythema. No pallor.  Psychiatric: She has a normal mood and affect. Her behavior is normal. Judgment and thought content normal.    Data Reviewed Notes from cancer center. Recent mammograms. Old records.  Assessment    Invasive carcinoma right breast, lower inner quadrant, receptor positive, node negative, HER-2-negative, pathologic stage TIb. N0.  No evidence of recurrence 6 years following right partial mastectomy, sentinel node biopsy, adjuvant radiation therapy and adjuvant adjuvant antiestrogen therapy.    Plan    We talked about reconstruction and symmetry procedures. Offered to refer her to a Engineer, petroleum. She says that she is considering this but will want think about it for another year. She will let me know if she wants  referral earlier. . We will discuss this next year.  She wants to continue to followup with Dr. Donnie Coffin and with me. I will see her in one year.  Bilateral mammograms and next year July or August.       Geisha Abernathy M. Derrell Lolling, M.D., [facility] General and Minimally invasive Surgery Breast and Colorectal Surgery Office:   [phone] Pager:   [phone]  11/28/2011, 4:00 PM

## 2011-12-23 DIAGNOSIS — Z1211 Encounter for screening for malignant neoplasm of colon: Secondary | ICD-10-CM | POA: Diagnosis not present

## 2012-04-07 ENCOUNTER — Telehealth: Payer: Self-pay | Admitting: Oncology

## 2012-04-07 NOTE — Telephone Encounter (Signed)
S/W THE PT REGARDING THE REASSIGNING OF HER MD FROM DR RUBIN TO DR Darnelle Catalan. PER THE PT'S REQ SHE WOULD LIKE TO SEE DR Welton Flakes. ADVISED THE PT THAT SHE WILL BE GETTING A CALL FROM LISA MORRISON ALONG WITH A CALENDAR AND A F/U LETTER. PT UNDERSTOOD.

## 2012-04-14 ENCOUNTER — Encounter: Payer: Self-pay | Admitting: Oncology

## 2012-04-23 ENCOUNTER — Telehealth: Payer: Self-pay | Admitting: *Deleted

## 2012-04-23 NOTE — Telephone Encounter (Signed)
Pt called stating that she received her letter & calendar in the mail and she requested Dr. Welton Flakes.  Cancelled lab appt for 7/8 and confirmed rescheduled 07/26/12 appt w/ pt.  Mailed pt a new calendar.

## 2012-04-25 ENCOUNTER — Other Ambulatory Visit: Payer: Self-pay | Admitting: Oncology

## 2012-04-25 ENCOUNTER — Other Ambulatory Visit: Payer: Self-pay

## 2012-04-25 DIAGNOSIS — Z853 Personal history of malignant neoplasm of breast: Secondary | ICD-10-CM

## 2012-04-25 DIAGNOSIS — Z9889 Other specified postprocedural states: Secondary | ICD-10-CM

## 2012-05-15 DIAGNOSIS — E782 Mixed hyperlipidemia: Secondary | ICD-10-CM | POA: Diagnosis not present

## 2012-05-22 DIAGNOSIS — Z124 Encounter for screening for malignant neoplasm of cervix: Secondary | ICD-10-CM | POA: Diagnosis not present

## 2012-05-22 DIAGNOSIS — Z0000 Encounter for general adult medical examination without abnormal findings: Secondary | ICD-10-CM | POA: Diagnosis not present

## 2012-05-22 DIAGNOSIS — Z859 Personal history of malignant neoplasm, unspecified: Secondary | ICD-10-CM | POA: Diagnosis not present

## 2012-05-22 DIAGNOSIS — Z01419 Encounter for gynecological examination (general) (routine) without abnormal findings: Secondary | ICD-10-CM | POA: Diagnosis not present

## 2012-07-24 ENCOUNTER — Other Ambulatory Visit: Payer: Medicare Other | Admitting: Lab

## 2012-07-26 ENCOUNTER — Encounter: Payer: Self-pay | Admitting: Adult Health

## 2012-07-26 ENCOUNTER — Telehealth: Payer: Self-pay | Admitting: *Deleted

## 2012-07-26 ENCOUNTER — Ambulatory Visit (HOSPITAL_BASED_OUTPATIENT_CLINIC_OR_DEPARTMENT_OTHER): Payer: Medicare Other | Admitting: Adult Health

## 2012-07-26 ENCOUNTER — Ambulatory Visit (HOSPITAL_BASED_OUTPATIENT_CLINIC_OR_DEPARTMENT_OTHER): Payer: Medicare Other | Admitting: Lab

## 2012-07-26 VITALS — BP 155/90 | HR 86 | Temp 98.3°F | Resp 20 | Ht 61.0 in | Wt 180.3 lb

## 2012-07-26 DIAGNOSIS — C50311 Malignant neoplasm of lower-inner quadrant of right female breast: Secondary | ICD-10-CM

## 2012-07-26 DIAGNOSIS — Z853 Personal history of malignant neoplasm of breast: Secondary | ICD-10-CM | POA: Diagnosis not present

## 2012-07-26 DIAGNOSIS — E559 Vitamin D deficiency, unspecified: Secondary | ICD-10-CM

## 2012-07-26 LAB — CBC WITH DIFFERENTIAL/PLATELET
Basophils Absolute: 0 10*3/uL (ref 0.0–0.1)
EOS%: 3.7 % (ref 0.0–7.0)
HGB: 13.8 g/dL (ref 11.6–15.9)
MCH: 31.9 pg (ref 25.1–34.0)
MCV: 93.5 fL (ref 79.5–101.0)
MONO%: 12.5 % (ref 0.0–14.0)
NEUT#: 4.1 10*3/uL (ref 1.5–6.5)
RBC: 4.32 10*6/uL (ref 3.70–5.45)
RDW: 12.6 % (ref 11.2–14.5)
lymph#: 1.1 10*3/uL (ref 0.9–3.3)

## 2012-07-26 LAB — COMPREHENSIVE METABOLIC PANEL (CC13)
ALT: 17 U/L (ref 0–55)
AST: 19 U/L (ref 5–34)
Albumin: 3.4 g/dL — ABNORMAL LOW (ref 3.5–5.0)
Alkaline Phosphatase: 61 U/L (ref 40–150)
BUN: 8.8 mg/dL (ref 7.0–26.0)
Calcium: 10.3 mg/dL (ref 8.4–10.4)
Chloride: 106 mEq/L (ref 98–109)
Potassium: 3.6 mEq/L (ref 3.5–5.1)
Sodium: 144 mEq/L (ref 136–145)
Total Protein: 7.5 g/dL (ref 6.4–8.3)

## 2012-07-26 NOTE — Progress Notes (Signed)
OFFICE PROGRESS NOTE  CCLupita Raider, MD [address] [place] [address]  DIAGNOSIS: 67 y/o female with h/o stage IA ER positive, PR positive, HER-2/neu negative, stage IA invasive ductal carcinoma of the right breast diagnosed in June 2007.    PRIOR THERAPY:  1. Patient had screening mammogram that revealed a 6-8 mm mass in the right breast at 5 o'clock on 07/05/05, she went on to have a biopsy that demonstrated invasive mammary carcinoma ER 94%, PR 93%, HER-2/neu negative with a Ki-67 of 5%.  She underwent an MRI that confirmed a solitary 1.1 cm mass.  Bone scan August 2007 was negative for metastases.   2. Patient underwent lumpectomy on 08/03/2005 where a grade 2, 0.9 cm invasive ductal carcinoma was removed with negative margins.  Sentinel nodes were negative.  No oncotype testing was done at the patients request.    3.  Following surgery patient underwent radiation therapy from 09/12/2005 through 11/01/2005 under the care and guidance of Dr. Kathrynn Running.   4. Patients was started on Arimidex on 12/12/2005 and completed 5 years of therapy.  Per her report she had bone densities every 2 years and was on a bisphosphonate for a couple of years due to osteopenia, but repeat bone densities have been negative, therefore she stopped bisphosphonate therapy.    CURRENT THERAPY: Observation  INTERVAL HISTORY: Julie Ashley 68 y.o. female returns for follow up of her h/o stage IA invasive ductal carcinoma of the right breast diagnosed in 2007.  She is doing well today.  She is retired.  Her exercise is spending time with mom who has alzheimers, and gardening.  A 10 point ROS is negative.   MEDICAL HISTORY: Past Medical History  Diagnosis Date  . 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 Cancer     rt breast    ALLERGIES:  is allergic to procaine hcl and sulfur.  MEDICATIONS:  Current Outpatient Prescriptions  Medication Sig Dispense Refill  . amitriptyline  (ELAVIL) 10 MG tablet Take 10 mg by mouth at bedtime.        Marland Kitchen aspirin 81 MG tablet Take 81 mg by mouth daily.      Marland Kitchen atenolol (TENORMIN) 100 MG tablet Take 100 mg by mouth daily.        Marland Kitchen atorvastatin (LIPITOR) 10 MG tablet Take 10 mg by mouth daily.        . Calcium Carbonate-Vitamin D (CALTRATE 600+D PO) Take by mouth.        Marland Kitchen lisinopril (PRINIVIL,ZESTRIL) 20 MG tablet Take 20 mg by mouth daily.        . meloxicam (MOBIC) 15 MG tablet Take 7.5 mg by mouth every other day.       . Multiple Vitamins-Calcium (ONE-A-DAY WOMENS PO) Take by mouth.         No current facility-administered medications for this visit.    SURGICAL HISTORY:  Past Surgical History  Procedure Laterality Date  . Foot surgery  1952/1953/1960  . Breast biopsy  1982  . Breast surgery  2007    rt lumpectomy    REVIEW OF SYSTEMS:  General: fatigue (-), night sweats (-), fever (-), pain (-) Lymph: palpable nodes (-) HEENT: vision changes (-), mucositis (-), gum bleeding (-), epistaxis (-) Cardiovascular: chest pain (-), palpitations (-) Pulmonary: shortness of breath (-), dyspnea on exertion (-), cough (-), hemoptysis (-) GI:  Early satiety (-), melena (-), dysphagia (-), nausea/vomiting (-),  diarrhea (-) GU: dysuria (-), hematuria (-), incontinence (-) Musculoskeletal: joint swelling (-), joint pain (-), back pain (-) Neuro: weakness (-), numbness (-), headache (-), confusion (-) Skin: Rash (-), lesions (-), dryness (-) Psych: depression (-), suicidal/homicidal ideation (-), feeling of hopelessness (-)   HEALTH MAINTENANCE:  Mammogram 08/16/11 Colonoscopy about 10 years ago Bone Density scheduled 07/30/12 Pap Smear 1 year ago Eye Exam annually Vitamin D last 05/13/10, normal Lipid Panel checked 6 months ago  PHYSICAL EXAMINATION: Blood pressure 155/90, pulse 86, temperature 98.3 F (36.8 C), temperature source Oral, resp. rate 20, height 5\' 1"  (1.549 m), weight 180 lb 4.8 oz (81.784 kg). Body mass index  is 34.09 kg/(m^2). General: Patient is a well appearing female in no acute distress HEENT: PERRLA, sclerae anicteric no conjunctival pallor, MMM Neck: supple, no palpable adenopathy Lungs: clear to auscultation bilaterally, no wheezes, rhonchi, or rales Cardiovascular: regular rate rhythm, S1, S2, no murmurs, rubs or gallops Abdomen: Soft, non-tender, non-distended, normoactive bowel sounds, no HSM Extremities: warm and well perfused, no clubbing, cyanosis, or edema Skin: No rashes or lesions Neuro: Non-focal ECOG PERFORMANCE STATUS: 1 - Symptomatic but completely ambulatory    LABORATORY DATA: Lab Results  Component Value Date   WBC 6.3 07/26/2012   HGB 13.8 07/26/2012   HCT 40.4 07/26/2012   MCV 93.5 07/26/2012   PLT 268 07/26/2012      Chemistry      Component Value Date/Time   NA 144 07/26/2012 1542   NA 141 05/31/2011 1518   K 3.6 07/26/2012 1542   K 3.5 05/31/2011 1518   CL 106 05/31/2011 1518   CO2 28 07/26/2012 1542   CO2 26 05/31/2011 1518   BUN 8.8 07/26/2012 1542   BUN 9 05/31/2011 1518   CREATININE 0.9 07/26/2012 1542   CREATININE 0.75 05/31/2011 1518      Component Value Date/Time   CALCIUM 10.3 07/26/2012 1542   CALCIUM 9.4 05/31/2011 1518   ALKPHOS 61 07/26/2012 1542   ALKPHOS 68 05/31/2011 1518   AST 19 07/26/2012 1542   AST 19 05/31/2011 1518   ALT 17 07/26/2012 1542   ALT 15 05/31/2011 1518   BILITOT 0.36 07/26/2012 1542   BILITOT 0.3 05/31/2011 1518       RADIOGRAPHIC STUDIES:  No results found.  ASSESSMENT:   Patient is a 67 year old female who has a history of stage IA invasive ductal carcinoma of the right breast diagnosed in 2007.  She has completed lumpectomy, radiation therapy, and 5 years of anti-estrogen therapy.  She has continued to have routine f/u and annual mammograms as well as biennial bone density scans.    PLAN:  Doing well.  No sign of recurrence.  We discussed survivorship.  She has a DEXA scheduled for 07/30/12, and annual mammo on 08/21/12.  We  will see her back in 1 year.    All questions were answered. The patient knows to call the clinic with any problems, questions or concerns. We can certainly see the patient much sooner if necessary.  I spent 40 minutes counseling the patient face to face. The total time spent in the appointment was 60 minutes.  Cherie Ouch Lyn Hollingshead, NP Medical Oncology [facility] Phone: [phone] 07/27/2012, 9:17 AM

## 2012-07-26 NOTE — Telephone Encounter (Signed)
appts made printed.....td 

## 2012-07-26 NOTE — Patient Instructions (Addendum)
Doing well.  We will get labs today and see you back in 1 year.  Please call us if you have any questions or concerns.

## 2012-07-27 ENCOUNTER — Other Ambulatory Visit: Payer: Self-pay | Admitting: Oncology

## 2012-07-27 DIAGNOSIS — M8580 Other specified disorders of bone density and structure, unspecified site: Secondary | ICD-10-CM

## 2012-07-27 DIAGNOSIS — Z9221 Personal history of antineoplastic chemotherapy: Secondary | ICD-10-CM

## 2012-07-27 DIAGNOSIS — IMO0002 Reserved for concepts with insufficient information to code with codable children: Secondary | ICD-10-CM

## 2012-07-27 DIAGNOSIS — Z853 Personal history of malignant neoplasm of breast: Secondary | ICD-10-CM

## 2012-07-27 DIAGNOSIS — E559 Vitamin D deficiency, unspecified: Secondary | ICD-10-CM

## 2012-07-27 DIAGNOSIS — C50919 Malignant neoplasm of unspecified site of unspecified female breast: Secondary | ICD-10-CM

## 2012-07-29 NOTE — Progress Notes (Signed)
ATTENDING'S ATTESTATION:  I personally reviewed patient's chart, examined patient myself, formulated the treatment plan as followed.    Patient is 67 year old female with stage IA ER/PR positive HER-2/neu negative invasive ductal carcinoma originally diagnosed in June 2007. She underwent a lumpectomy followed by radiation therapy which he completed in October 2007.she was then started on a remedy X. And completed 5 years of this.  Patient has been having her bone densities performed every 2 years and she has been on bisphosphonate for osteopenia.then it was discontinued to 2 bone densities being normal  Currently patient is doing well she is due for another DEXA on 07/30/2012 an annual mammogram in August of this year. We will continue to see her once a year for now.  Drue Second, MD Medical/Oncology [facility] [phone] (beeper) [phone] (Office)

## 2012-07-30 ENCOUNTER — Ambulatory Visit
Admission: RE | Admit: 2012-07-30 | Discharge: 2012-07-30 | Disposition: A | Discharge status: Home / Self Care | Payer: Medicare Other | Source: Ambulatory Visit | Attending: Oncology | Admitting: Oncology

## 2012-07-30 ENCOUNTER — Other Ambulatory Visit: Payer: Medicare Other

## 2012-07-30 DIAGNOSIS — M8580 Other specified disorders of bone density and structure, unspecified site: Secondary | ICD-10-CM

## 2012-07-30 DIAGNOSIS — Z780 Asymptomatic menopausal state: Secondary | ICD-10-CM | POA: Diagnosis not present

## 2012-07-31 ENCOUNTER — Ambulatory Visit: Payer: Medicare Other | Admitting: Family

## 2012-07-31 ENCOUNTER — Telehealth: Payer: Self-pay | Admitting: Oncology

## 2012-07-31 ENCOUNTER — Ambulatory Visit: Payer: Medicare Other | Admitting: Oncology

## 2012-07-31 NOTE — Telephone Encounter (Signed)
Pt called today and moved 7/10 appt to 7/16 and requested that appt be w/KK. gv pt appt for 7/16 @ 2pm lb/KK.

## 2012-08-03 ENCOUNTER — Other Ambulatory Visit: Payer: Medicare Other

## 2012-08-10 ENCOUNTER — Telehealth: Payer: Self-pay | Admitting: Medical Oncology

## 2012-08-10 NOTE — Telephone Encounter (Signed)
Patient called for blood test and bone density results. Per NP, informed patient both results normal and for patient to continue with Vit D and calcium. Patient expressed thanks, denies any questions. Knows to call office with any questions or concerns.

## 2012-08-21 ENCOUNTER — Ambulatory Visit
Admission: RE | Admit: 2012-08-21 | Discharge: 2012-08-21 | Disposition: A | Discharge status: Home / Self Care | Payer: Medicare Other | Source: Ambulatory Visit | Attending: Oncology | Admitting: Oncology

## 2012-08-21 DIAGNOSIS — Z853 Personal history of malignant neoplasm of breast: Secondary | ICD-10-CM | POA: Diagnosis not present

## 2012-08-21 DIAGNOSIS — Z9889 Other specified postprocedural states: Secondary | ICD-10-CM

## 2012-11-07 DIAGNOSIS — Z23 Encounter for immunization: Secondary | ICD-10-CM | POA: Diagnosis not present

## 2012-11-14 DIAGNOSIS — E782 Mixed hyperlipidemia: Secondary | ICD-10-CM | POA: Diagnosis not present

## 2012-11-14 DIAGNOSIS — Z6834 Body mass index (BMI) 34.0-34.9, adult: Secondary | ICD-10-CM | POA: Diagnosis not present

## 2012-11-14 DIAGNOSIS — Z1211 Encounter for screening for malignant neoplasm of colon: Secondary | ICD-10-CM | POA: Diagnosis not present

## 2012-11-14 DIAGNOSIS — I10 Essential (primary) hypertension: Secondary | ICD-10-CM | POA: Diagnosis not present

## 2012-11-14 DIAGNOSIS — Z0000 Encounter for general adult medical examination without abnormal findings: Secondary | ICD-10-CM | POA: Diagnosis not present

## 2012-11-14 DIAGNOSIS — C50919 Malignant neoplasm of unspecified site of unspecified female breast: Secondary | ICD-10-CM | POA: Diagnosis not present

## 2012-11-14 DIAGNOSIS — E669 Obesity, unspecified: Secondary | ICD-10-CM | POA: Diagnosis not present

## 2012-11-14 DIAGNOSIS — Z8612 Personal history of poliomyelitis: Secondary | ICD-10-CM | POA: Diagnosis not present

## 2012-11-14 DIAGNOSIS — Z23 Encounter for immunization: Secondary | ICD-10-CM | POA: Diagnosis not present

## 2012-12-04 ENCOUNTER — Encounter (INDEPENDENT_AMBULATORY_CARE_PROVIDER_SITE_OTHER): Payer: Self-pay | Admitting: General Surgery

## 2012-12-04 ENCOUNTER — Ambulatory Visit (INDEPENDENT_AMBULATORY_CARE_PROVIDER_SITE_OTHER): Payer: Medicare Other | Admitting: General Surgery

## 2012-12-04 VITALS — BP 142/80 | HR 78 | Temp 97.9°F | Resp 12 | Ht 60.5 in | Wt 179.4 lb

## 2012-12-04 DIAGNOSIS — C50311 Malignant neoplasm of lower-inner quadrant of right female breast: Secondary | ICD-10-CM

## 2012-12-04 DIAGNOSIS — C50319 Malignant neoplasm of lower-inner quadrant of unspecified female breast: Secondary | ICD-10-CM

## 2012-12-04 NOTE — Progress Notes (Signed)
Patient ID: Julie Ashley, female   DOB: [birth year], 66 y.o.   MRN: [id number] History: Julie Ashley is a 67 y.o. female. She returns for long-term followup of her right breast cancer.  On 07/04/2005 this patient underwent right partial mastectomy and sentinel lymph node biopsy for invasive cancer of the right breast at the 5:00 position, lower inner quadrant. Pathology was T1b., N0, receptor positive, HER-2-negative. She had adjuvant radiation therapy and was on anastrozole for 5 years which is now discontinued.  She has no new complaints about her breast. She notices that the right breast is a little bit smaller than the left but says that her clothes and bra fit well . Last mammogram on 08/21/2012 looks good, Cat. 2.   Her daughter was diagnosed with breast cancer in 2009. She had lumpectomy radiation therapy and antiestrogen therapy and is doing well. Genitalia single the bar was negative. The patient is busy called her mother has Alzheimer's disease and she has to go see her every day.   Social history, family history, past history, and review of systems are reviewed, unchanged, documented chart and not stripped away.   Exam: Patient is alert. No distress. Very friendly. Neck reveals no adenopathy or mass Lungs are clear to auscultation bilaterally Heart regular rate and rhythm. No murmur. No ectopy Right breast 10% smaller than left. Well-healed scars right axilla and right breast lower inner quadrant. No palpable mass. No axillary adenopathy on either side.      Assessment:  invasive carcinoma right breast, lower inner quadrant, receptor positive, node negative, HER-2-negative, pathologic stage TIb. N0.  No evidence of recurrence 7 years following right partial mastectomy, sentinel node biopsy, adjuvant radiation therapy and adjuvant adjuvant antiestrogen therapy.    Plan: We talked about her risk factors, survivor ship issues, surveillance issues. We both understand that she really  needs to be evaluated only  once a year with careful physical exam and mammogram. We talked about different  options.  At this point in time we decided she would graduate from my care and that she would continue to see Dr. Drue Second and Dr. Cam Hai annually. She knows  she needs mammograms annually.  I will standby should new problems arise.    Angelia Mould. Derrell Lolling, M.D., [facility] General and Minimally invasive Surgery Breast and Colorectal Surgery Office:   [phone] Pager:   [phone]

## 2012-12-04 NOTE — Patient Instructions (Signed)
Examination of your breasts and lymph node areas is normal. Your recent mammograms are also normal. There is no evidence of cancer.  We talked about long-term surveillance and breast cancer risk issues.  At this point in time we decided you would graduate from my care. You will get annual mammograms and get annual exam Dr. Drue Second from here on out.  Return to see Dr. Derrell Lolling as needed.

## 2013-03-25 DIAGNOSIS — R300 Dysuria: Secondary | ICD-10-CM | POA: Diagnosis not present

## 2013-05-22 DIAGNOSIS — Z1211 Encounter for screening for malignant neoplasm of colon: Secondary | ICD-10-CM | POA: Diagnosis not present

## 2013-06-19 DIAGNOSIS — Z0000 Encounter for general adult medical examination without abnormal findings: Secondary | ICD-10-CM | POA: Diagnosis not present

## 2013-06-19 DIAGNOSIS — Z01419 Encounter for gynecological examination (general) (routine) without abnormal findings: Secondary | ICD-10-CM | POA: Diagnosis not present

## 2013-06-19 DIAGNOSIS — C50919 Malignant neoplasm of unspecified site of unspecified female breast: Secondary | ICD-10-CM | POA: Diagnosis not present

## 2013-06-19 DIAGNOSIS — Z124 Encounter for screening for malignant neoplasm of cervix: Secondary | ICD-10-CM | POA: Diagnosis not present

## 2013-07-01 ENCOUNTER — Other Ambulatory Visit: Payer: Self-pay | Admitting: Gastroenterology

## 2013-07-01 DIAGNOSIS — Z1211 Encounter for screening for malignant neoplasm of colon: Secondary | ICD-10-CM | POA: Diagnosis not present

## 2013-07-01 DIAGNOSIS — K5730 Diverticulosis of large intestine without perforation or abscess without bleeding: Secondary | ICD-10-CM | POA: Diagnosis not present

## 2013-07-01 DIAGNOSIS — D126 Benign neoplasm of colon, unspecified: Secondary | ICD-10-CM | POA: Diagnosis not present

## 2013-07-17 ENCOUNTER — Other Ambulatory Visit: Payer: Self-pay

## 2013-07-17 DIAGNOSIS — Z1231 Encounter for screening mammogram for malignant neoplasm of breast: Secondary | ICD-10-CM

## 2013-07-17 DIAGNOSIS — Z853 Personal history of malignant neoplasm of breast: Secondary | ICD-10-CM

## 2013-07-24 ENCOUNTER — Telehealth: Payer: Self-pay | Admitting: Hematology and Oncology

## 2013-07-26 ENCOUNTER — Ambulatory Visit: Payer: Medicare Other | Admitting: Adult Health

## 2013-07-26 ENCOUNTER — Other Ambulatory Visit: Payer: Medicare Other | Admitting: Lab

## 2013-08-01 ENCOUNTER — Other Ambulatory Visit: Payer: Medicare Other

## 2013-08-01 ENCOUNTER — Ambulatory Visit: Payer: Medicare Other | Admitting: Oncology

## 2013-08-27 ENCOUNTER — Ambulatory Visit
Admission: RE | Admit: 2013-08-27 | Discharge: 2013-08-27 | Disposition: A | Discharge status: Home / Self Care | Payer: Medicare Other | Source: Ambulatory Visit

## 2013-08-27 DIAGNOSIS — Z853 Personal history of malignant neoplasm of breast: Secondary | ICD-10-CM

## 2013-08-27 DIAGNOSIS — Z1231 Encounter for screening mammogram for malignant neoplasm of breast: Secondary | ICD-10-CM | POA: Diagnosis not present

## 2013-09-27 ENCOUNTER — Telehealth: Payer: Self-pay | Admitting: Hematology and Oncology

## 2013-09-27 NOTE — Telephone Encounter (Signed)
S/w pt to advise of appt time chg on 10/19. Gave new appt 11am. Pt verbalized understanding.

## 2013-09-30 ENCOUNTER — Telehealth: Payer: Self-pay | Admitting: Hematology and Oncology

## 2013-09-30 NOTE — Telephone Encounter (Signed)
, °

## 2013-10-02 ENCOUNTER — Telehealth: Payer: Self-pay | Admitting: Hematology and Oncology

## 2013-10-02 NOTE — Telephone Encounter (Signed)
, °

## 2013-11-04 ENCOUNTER — Ambulatory Visit: Payer: Medicare Other | Admitting: Hematology and Oncology

## 2013-11-04 ENCOUNTER — Other Ambulatory Visit: Payer: Self-pay

## 2013-11-04 ENCOUNTER — Other Ambulatory Visit: Payer: Medicare Other

## 2013-11-04 DIAGNOSIS — C50319 Malignant neoplasm of lower-inner quadrant of unspecified female breast: Secondary | ICD-10-CM

## 2013-11-05 ENCOUNTER — Other Ambulatory Visit (HOSPITAL_BASED_OUTPATIENT_CLINIC_OR_DEPARTMENT_OTHER): Payer: Medicare Other

## 2013-11-05 ENCOUNTER — Ambulatory Visit (HOSPITAL_BASED_OUTPATIENT_CLINIC_OR_DEPARTMENT_OTHER): Payer: Medicare Other | Admitting: Hematology and Oncology

## 2013-11-05 ENCOUNTER — Telehealth: Payer: Self-pay | Admitting: Hematology and Oncology

## 2013-11-05 VITALS — BP 149/72 | HR 83 | Temp 98.4°F | Resp 18 | Ht 60.5 in | Wt 182.0 lb

## 2013-11-05 DIAGNOSIS — Z853 Personal history of malignant neoplasm of breast: Secondary | ICD-10-CM | POA: Diagnosis not present

## 2013-11-05 DIAGNOSIS — C50319 Malignant neoplasm of lower-inner quadrant of unspecified female breast: Secondary | ICD-10-CM

## 2013-11-05 DIAGNOSIS — Z23 Encounter for immunization: Secondary | ICD-10-CM | POA: Diagnosis not present

## 2013-11-05 LAB — CBC WITH DIFFERENTIAL/PLATELET
BASO%: 1 % (ref 0.0–2.0)
Basophils Absolute: 0.1 10*3/uL (ref 0.0–0.1)
EOS ABS: 0.3 10*3/uL (ref 0.0–0.5)
EOS%: 3.7 % (ref 0.0–7.0)
HEMATOCRIT: 42.4 % (ref 34.8–46.6)
HEMOGLOBIN: 14.1 g/dL (ref 11.6–15.9)
LYMPH%: 28.3 % (ref 14.0–49.7)
MCH: 31.7 pg (ref 25.1–34.0)
MCHC: 33.3 g/dL (ref 31.5–36.0)
MCV: 95.3 fL (ref 79.5–101.0)
MONO#: 0.9 10*3/uL (ref 0.1–0.9)
MONO%: 10.7 % (ref 0.0–14.0)
NEUT%: 56.3 % (ref 38.4–76.8)
NEUTROS ABS: 4.6 10*3/uL (ref 1.5–6.5)
PLATELETS: 303 10*3/uL (ref 145–400)
RBC: 4.45 10*6/uL (ref 3.70–5.45)
RDW: 12.3 % (ref 11.2–14.5)
WBC: 8.2 10*3/uL (ref 3.9–10.3)
lymph#: 2.3 10*3/uL (ref 0.9–3.3)

## 2013-11-05 LAB — COMPREHENSIVE METABOLIC PANEL (CC13)
ALT: 16 U/L (ref 0–55)
ANION GAP: 10 meq/L (ref 3–11)
AST: 18 U/L (ref 5–34)
Albumin: 3.6 g/dL (ref 3.5–5.0)
Alkaline Phosphatase: 67 U/L (ref 40–150)
BILIRUBIN TOTAL: 0.35 mg/dL (ref 0.20–1.20)
BUN: 9.3 mg/dL (ref 7.0–26.0)
CALCIUM: 10.3 mg/dL (ref 8.4–10.4)
CO2: 27 meq/L (ref 22–29)
CREATININE: 0.8 mg/dL (ref 0.6–1.1)
Chloride: 109 mEq/L (ref 98–109)
GLUCOSE: 91 mg/dL (ref 70–140)
Potassium: 3.7 mEq/L (ref 3.5–5.1)
SODIUM: 145 meq/L (ref 136–145)
TOTAL PROTEIN: 7.5 g/dL (ref 6.4–8.3)

## 2013-11-05 NOTE — Assessment & Plan Note (Signed)
Right breast invasive ductal carcinoma 0.9 cm mass T1 B. N0 M0 stage IA ER 94% PR 93% HER-2 negative Ki-67 5% status post lumpectomy and radiation therapy in 2007. To Arimidex in 2007 to 2012.  Surveillance: I reviewed the mammogram and ultrasound report as well as physical exam done today does not reveal any abnormalities. Recommended annual surveillance exams and mammograms. Survivorship: Discussed importance of exercise. She has polio the effects of right leg but otherwise she is staying active and stays busy.  Patient decided to hold off on doing breast reconstruction after thinking about the pros and cons of it. 

## 2013-11-05 NOTE — Telephone Encounter (Signed)
, °

## 2013-11-05 NOTE — Progress Notes (Signed)
Patient Care Team: Mayra Neer, MD as PCP - General (Family Medicine)  DIAGNOSIS: Right breast invasive ductal carcinoma T1 B. N0 M0 stage IA diagnosed 2007  PRIOR THERAPY:  1. Patient had screening mammogram that revealed a 6-8 mm mass in the right breast at 5 o'clock on 07/05/05, she went on to have a biopsy that demonstrated invasive mammary carcinoma ER 94%, PR 93%, HER-2/neu negative with a Ki-67 of 5%. She underwent an MRI that confirmed a solitary 1.1 cm mass. Bone scan August 2007 was negative for metastases.  2. Patient underwent lumpectomy on 08/03/2005 where a grade 2, 0.9 cm invasive ductal carcinoma was removed with negative margins. Sentinel nodes were negative. No oncotype testing was done at the patients request.  3. Following surgery patient underwent radiation therapy from 09/12/2005 through 11/01/2005 under the care and guidance of Dr. Tammi Klippel.  4. Patients was started on Arimidex on 12/12/2005 and completed 5 years of therapy. Per her report she had bone densities every 2 years and was on a bisphosphonate for a couple of years due to osteopenia, but repeat bone densities have been negative, therefore she stopped bisphosphonate therapy.   CURRENT THERAPY: Observation  CHIEF COMPLIANT: Followup of breast cancer  INTERVAL HISTORY: Julie Ashley is a 68 year old Caucasian with above-mentioned history of stage I right breast cancer diagnosed in 2007 treated with lumpectomy followed by radiation therapy. She received Arimidex for 5 years. She is currently on surveillance with annual mammograms and clinical breast exams and followup. She reports no new problems or concerns. Patient has a history of polio which limits her activity level because that to fuse her ankle. He denies new lumps or bumps. She had bone density test also been recently.  REVIEW OF SYSTEMS:   Constitutional: Denies fevers, chills or abnormal weight loss Eyes: Denies blurriness of vision Ears, nose, mouth, throat,  and face: Denies mucositis or sore throat Respiratory: Denies cough, dyspnea or wheezes Cardiovascular: Denies palpitation, chest discomfort or lower extremity swelling Gastrointestinal:  Denies nausea, heartburn or change in bowel habits Skin: Denies abnormal skin rashes Lymphatics: Denies new lymphadenopathy or easy bruising Neurological:Denies numbness, tingling or new weaknesses Behavioral/Psych: Mood is stable, no new changes  Breast:  denies any pain or lumps or nodules in either breasts All other systems were reviewed with the patient and are negative.  I have reviewed the past medical history, past surgical history, social history and family history with the patient and they are unchanged from previous note.  ALLERGIES:  is allergic to procaine hcl and sulfur.  MEDICATIONS:  Current Outpatient Prescriptions  Medication Sig Dispense Refill  . amitriptyline (ELAVIL) 10 MG tablet Take 10 mg by mouth at bedtime.        Marland Kitchen aspirin 81 MG tablet Take 81 mg by mouth daily.      Marland Kitchen atenolol (TENORMIN) 100 MG tablet Take 100 mg by mouth daily.        Marland Kitchen atorvastatin (LIPITOR) 10 MG tablet Take 10 mg by mouth daily.        . Calcium Carbonate-Vitamin D (CALTRATE 600+D PO) Take by mouth.        Marland Kitchen lisinopril (PRINIVIL,ZESTRIL) 20 MG tablet Take 20 mg by mouth daily.        . meloxicam (MOBIC) 15 MG tablet Take 7.5 mg by mouth every other day.       . Multiple Vitamins-Calcium (ONE-A-DAY WOMENS PO) Take by mouth.         No current facility-administered medications  for this visit.    PHYSICAL EXAMINATION: ECOG PERFORMANCE STATUS: 1 - Symptomatic but completely ambulatory  Filed Vitals:   11/05/13 1435  BP: 149/72  Pulse: 83  Temp: 98.4 F (36.9 C)  Resp: 18   Filed Weights   11/05/13 1435  Weight: 182 lb (82.555 kg)    GENERAL:alert, no distress and comfortable SKIN: skin color, texture, turgor are normal, no rashes or significant lesions EYES: normal, Conjunctiva are pink and  non-injected, sclera clear OROPHARYNX:no exudate, no erythema and lips, buccal mucosa, and tongue normal  NECK: supple, thyroid normal size, non-tender, without nodularity LYMPH:  no palpable lymphadenopathy in the cervical, axillary or inguinal LUNGS: clear to auscultation and percussion with normal breathing effort HEART: regular rate & rhythm and no murmurs and no lower extremity edema ABDOMEN:abdomen soft, non-tender and normal bowel sounds Musculoskeletal:no cyanosis of digits and no clubbing  NEURO: alert & oriented x 3 with fluent speech, no focal motor/sensory deficits BREAST: No palpable masses or nodules in either right or left breasts. No palpable axillary supraclavicular or infraclavicular adenopathy no breast tenderness or nipple discharge.   LABORATORY DATA:  I have reviewed the data as listed   Chemistry      Component Value Date/Time   NA 145 11/05/2013 1416   NA 141 05/31/2011 1518   K 3.7 11/05/2013 1416   K 3.5 05/31/2011 1518   CL 106 05/31/2011 1518   CO2 27 11/05/2013 1416   CO2 26 05/31/2011 1518   BUN 9.3 11/05/2013 1416   BUN 9 05/31/2011 1518   CREATININE 0.8 11/05/2013 1416   CREATININE 0.75 05/31/2011 1518      Component Value Date/Time   CALCIUM 10.3 11/05/2013 1416   CALCIUM 9.4 05/31/2011 1518   ALKPHOS 67 11/05/2013 1416   ALKPHOS 68 05/31/2011 1518   AST 18 11/05/2013 1416   AST 19 05/31/2011 1518   ALT 16 11/05/2013 1416   ALT 15 05/31/2011 1518   BILITOT 0.35 11/05/2013 1416   BILITOT 0.3 05/31/2011 1518       Lab Results  Component Value Date   WBC 8.2 11/05/2013   HGB 14.1 11/05/2013   HCT 42.4 11/05/2013   MCV 95.3 11/05/2013   PLT 303 11/05/2013   NEUTROABS 4.6 11/05/2013     RADIOGRAPHIC STUDIES: I have personally reviewed the radiology reports and agreed with their findings. No results found.   ASSESSMENT & PLAN:  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 N0 M0  stage IA ER 94% PR 93% HER-2 negative Ki-67 5% status post lumpectomy and radiation therapy in 2007. To Arimidex in 2007 to 2012.  Surveillance: I reviewed the mammogram and ultrasound report as well as physical exam done today does not reveal any abnormalities. Recommended annual surveillance exams and mammograms. Survivorship: Discussed importance of exercise. She has polio the effects of right leg but otherwise she is staying active and stays busy.  Patient decided to hold off on doing breast reconstruction after thinking about the pros and cons of it.     Orders Placed This Encounter  Procedures  . CBC with Differential    Standing Status: Future     Number of Occurrences:      Standing Expiration Date: 11/05/2014  . Comprehensive metabolic panel (Cmet) - [facility]    Standing Status: Future     Number of Occurrences:      Standing Expiration Date: 11/05/2014   The patient has  a good understanding of the overall plan. she agrees with it. She will call with any problems that may develop before her next visit here.  I spent 15 minutes counseling the patient face to face. The total time spent in the appointment was 20 minutes and more than 50% was on counseling and review of test results    Rulon Eisenmenger, MD 11/05/2013 3:34 PM

## 2013-11-19 DIAGNOSIS — C50311 Malignant neoplasm of lower-inner quadrant of right female breast: Secondary | ICD-10-CM

## 2013-11-21 ENCOUNTER — Other Ambulatory Visit: Payer: Self-pay | Admitting: Hematology and Oncology

## 2013-11-21 DIAGNOSIS — Z1231 Encounter for screening mammogram for malignant neoplasm of breast: Secondary | ICD-10-CM

## 2013-11-27 DIAGNOSIS — Z853 Personal history of malignant neoplasm of breast: Secondary | ICD-10-CM | POA: Diagnosis not present

## 2013-11-27 DIAGNOSIS — Z0001 Encounter for general adult medical examination with abnormal findings: Secondary | ICD-10-CM | POA: Diagnosis not present

## 2013-11-27 DIAGNOSIS — I10 Essential (primary) hypertension: Secondary | ICD-10-CM | POA: Diagnosis not present

## 2013-11-27 DIAGNOSIS — E669 Obesity, unspecified: Secondary | ICD-10-CM | POA: Diagnosis not present

## 2013-11-27 DIAGNOSIS — Z8612 Personal history of poliomyelitis: Secondary | ICD-10-CM | POA: Diagnosis not present

## 2013-11-27 DIAGNOSIS — E782 Mixed hyperlipidemia: Secondary | ICD-10-CM | POA: Diagnosis not present

## 2013-11-27 DIAGNOSIS — Z6834 Body mass index (BMI) 34.0-34.9, adult: Secondary | ICD-10-CM | POA: Diagnosis not present

## 2014-07-08 DIAGNOSIS — Z124 Encounter for screening for malignant neoplasm of cervix: Secondary | ICD-10-CM | POA: Diagnosis not present

## 2014-07-08 DIAGNOSIS — Z01419 Encounter for gynecological examination (general) (routine) without abnormal findings: Secondary | ICD-10-CM | POA: Diagnosis not present

## 2014-09-11 ENCOUNTER — Ambulatory Visit
Admission: RE | Admit: 2014-09-11 | Discharge: 2014-09-11 | Disposition: A | Discharge status: Home / Self Care | Payer: Medicare Other | Source: Ambulatory Visit | Attending: Hematology and Oncology | Admitting: Hematology and Oncology

## 2014-09-11 DIAGNOSIS — Z1231 Encounter for screening mammogram for malignant neoplasm of breast: Secondary | ICD-10-CM

## 2014-10-28 DIAGNOSIS — Z23 Encounter for immunization: Secondary | ICD-10-CM | POA: Diagnosis not present

## 2014-11-11 ENCOUNTER — Ambulatory Visit: Payer: Medicare Other | Admitting: Hematology and Oncology

## 2014-11-11 ENCOUNTER — Other Ambulatory Visit: Payer: Medicare Other

## 2014-11-25 ENCOUNTER — Other Ambulatory Visit: Payer: Self-pay | Admitting: *Deleted

## 2014-11-25 DIAGNOSIS — C50311 Malignant neoplasm of lower-inner quadrant of right female breast: Secondary | ICD-10-CM

## 2014-11-25 NOTE — Assessment & Plan Note (Signed)
Right breast invasive ductal carcinoma 0.9 cm mass T1 B. N0 M0 stage IA ER 94% PR 93% HER-2 negative Ki-67 5% status post lumpectomy and radiation therapy in 2007. To Arimidex in 2007 to 2012.  Surveillance: I reviewed the mammogram and ultrasound report as well as physical exam done today does not reveal any abnormalities. Recommended annual surveillance exams and mammograms. Survivorship: Discussed importance of exercise. She has polio the effects of right leg but otherwise she is staying active and stays busy.  Patient decided to hold off on doing breast reconstruction after thinking about the pros and cons of it.

## 2014-11-26 ENCOUNTER — Telehealth: Payer: Self-pay | Admitting: Hematology and Oncology

## 2014-11-26 ENCOUNTER — Other Ambulatory Visit (HOSPITAL_BASED_OUTPATIENT_CLINIC_OR_DEPARTMENT_OTHER): Payer: Medicare Other

## 2014-11-26 ENCOUNTER — Ambulatory Visit (HOSPITAL_BASED_OUTPATIENT_CLINIC_OR_DEPARTMENT_OTHER): Payer: Medicare Other | Admitting: Hematology and Oncology

## 2014-11-26 ENCOUNTER — Encounter: Payer: Self-pay | Admitting: Hematology and Oncology

## 2014-11-26 VITALS — BP 163/84 | HR 84 | Temp 97.6°F | Resp 18 | Ht 60.5 in | Wt 179.3 lb

## 2014-11-26 DIAGNOSIS — Z853 Personal history of malignant neoplasm of breast: Secondary | ICD-10-CM | POA: Diagnosis not present

## 2014-11-26 DIAGNOSIS — C50311 Malignant neoplasm of lower-inner quadrant of right female breast: Secondary | ICD-10-CM

## 2014-11-26 LAB — CBC WITH DIFFERENTIAL/PLATELET
BASO%: 0.9 % (ref 0.0–2.0)
Basophils Absolute: 0.1 10*3/uL (ref 0.0–0.1)
EOS ABS: 0.3 10*3/uL (ref 0.0–0.5)
EOS%: 4.8 % (ref 0.0–7.0)
HEMATOCRIT: 43.1 % (ref 34.8–46.6)
HEMOGLOBIN: 14.4 g/dL (ref 11.6–15.9)
LYMPH%: 25.7 % (ref 14.0–49.7)
MCH: 31.5 pg (ref 25.1–34.0)
MCHC: 33.3 g/dL (ref 31.5–36.0)
MCV: 94.4 fL (ref 79.5–101.0)
MONO#: 0.9 10*3/uL (ref 0.1–0.9)
MONO%: 12 % (ref 0.0–14.0)
NEUT%: 56.6 % (ref 38.4–76.8)
NEUTROS ABS: 4.1 10*3/uL (ref 1.5–6.5)
PLATELETS: 287 10*3/uL (ref 145–400)
RBC: 4.56 10*6/uL (ref 3.70–5.45)
RDW: 12.5 % (ref 11.2–14.5)
WBC: 7.3 10*3/uL (ref 3.9–10.3)
lymph#: 1.9 10*3/uL (ref 0.9–3.3)

## 2014-11-26 LAB — COMPREHENSIVE METABOLIC PANEL (CC13)
ALBUMIN: 3.6 g/dL (ref 3.5–5.0)
ALK PHOS: 59 U/L (ref 40–150)
ALT: 15 U/L (ref 0–55)
ANION GAP: 8 meq/L (ref 3–11)
AST: 18 U/L (ref 5–34)
BILIRUBIN TOTAL: 0.42 mg/dL (ref 0.20–1.20)
BUN: 7.6 mg/dL (ref 7.0–26.0)
CALCIUM: 10.1 mg/dL (ref 8.4–10.4)
CO2: 28 mEq/L (ref 22–29)
Chloride: 106 mEq/L (ref 98–109)
Creatinine: 0.8 mg/dL (ref 0.6–1.1)
EGFR: 75 mL/min/{1.73_m2} — AB (ref >90)
Glucose: 110 mg/dl (ref 70–140)
Potassium: 3.5 mEq/L (ref 3.5–5.1)
Sodium: 143 mEq/L (ref 136–145)
TOTAL PROTEIN: 7.6 g/dL (ref 6.4–8.3)

## 2014-11-26 NOTE — Addendum Note (Signed)
Addended by: Prentiss Bells on: 11/26/2014 04:50 PM   Modules accepted: Medications

## 2014-11-26 NOTE — Progress Notes (Signed)
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 She underwent an MRI that confirmed a solitary 1.1 cm mass    08/03/2005 Surgery Rt Lumpectomy: grade 2, 0.9 cm invasive ductal carcinoma was removed with negative margins. Sentinel nodes were negative. No oncotype testing was done at the patients request.    09/12/2005 - 11/01/2005 Radiation Therapy Adjuvant XRT   12/12/2005 - 12/13/2010 Anti-estrogen oral therapy Arimidex  X 5 years    CHIEF COMPLIANT: breast cancer surveillance  INTERVAL HISTORY: Julie Ashley is a 69 year old with above-mentioned history of right breast cancer treated with lumpectomy adjuvant radiation 5 years of antiestrogen therapy completed in 2012. She is here for annual surveillance. She reports no pain or discomfort. 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 But she stretches and does a lot of activities in the bed. Denies any lumps or nodules in the breast.  REVIEW OF SYSTEMS:   Constitutional: Denies fevers, chills or abnormal weight loss Eyes: Denies blurriness of vision Ears, nose, mouth, throat, and face: Denies mucositis or sore throat Respiratory: Denies cough, dyspnea or wheezes Cardiovascular: Denies palpitation, chest discomfort or lower extremity swelling Gastrointestinal:  Denies nausea, heartburn or change in bowel habits Skin: Denies abnormal skin rashes Lymphatics: Denies new lymphadenopathy or easy bruising Neurological:Denies numbness,  tingling or new weaknesses Behavioral/Psych: Mood is stable, no new changes  Breast:  denies any pain or lumps or nodules in either breasts All other systems were reviewed with the patient and are negative.  I have reviewed the past medical history, past surgical history, social history and family history with the patient and they are unchanged from previous note.  ALLERGIES:  is allergic to procaine hcl and sulfur.  MEDICATIONS:  Current Outpatient Prescriptions  Medication Sig Dispense Refill  . amitriptyline (ELAVIL) 10 MG tablet Take 10 mg by mouth at bedtime.      Marland Kitchen aspirin 81 MG tablet Take 81 mg by mouth daily.    Marland Kitchen atenolol (TENORMIN) 100 MG tablet Take 100 mg by mouth daily.      Marland Kitchen atorvastatin (LIPITOR) 10 MG tablet Take 10 mg by mouth daily.      . Calcium Carbonate-Vitamin D (CALTRATE 600+D PO) Take by mouth.      Marland Kitchen lisinopril (PRINIVIL,ZESTRIL) 20 MG tablet Take 20 mg by mouth daily.      . meloxicam (MOBIC) 15 MG tablet Take 7.5 mg by mouth every other day.     . Multiple Vitamins-Calcium (ONE-A-DAY WOMENS PO) Take by mouth.       No current facility-administered medications for this visit.    PHYSICAL EXAMINATION: ECOG PERFORMANCE STATUS: 0 - Asymptomatic  Filed Vitals:   11/26/14 1523  BP: 163/84  Pulse: 84  Temp: 97.6 F (36.4 C)  Resp: 18   Filed Weights   11/26/14 1523  Weight: 179 lb 4.8 oz (81.33 kg)    GENERAL:alert, no distress and comfortable SKIN: skin color, texture, turgor are  normal, no rashes or significant lesions EYES: normal, Conjunctiva are pink and non-injected, sclera clear OROPHARYNX:no exudate, no erythema and lips, buccal mucosa, and tongue normal  NECK: supple, thyroid normal size, non-tender, without nodularity LYMPH:  no palpable lymphadenopathy in the cervical, axillary or inguinal LUNGS: clear to auscultation and percussion with normal breathing effort HEART: regular rate & rhythm and no murmurs and no lower extremity  edema ABDOMEN:abdomen soft, non-tender and normal bowel sounds Musculoskeletal:no cyanosis of digits and no clubbing  NEURO: alert & oriented x 3 with fluent speech, no focal motor/sensory deficits BREAST: No palpable masses or nodules in either right or left breasts. No palpable axillary supraclavicular or infraclavicular adenopathy no breast tenderness or nipple discharge. (exam performed in the presence of a chaperone)  LABORATORY DATA:  I have reviewed the data as listed   Chemistry      Component Value Date/Time   NA 143 11/26/2014 1449   NA 141 05/31/2011 1518   K 3.5 11/26/2014 1449   K 3.5 05/31/2011 1518   CL 106 05/31/2011 1518   CO2 28 11/26/2014 1449   CO2 26 05/31/2011 1518   BUN 7.6 11/26/2014 1449   BUN 9 05/31/2011 1518   CREATININE 0.8 11/26/2014 1449   CREATININE 0.75 05/31/2011 1518      Component Value Date/Time   CALCIUM 10.1 11/26/2014 1449   CALCIUM 9.4 05/31/2011 1518   ALKPHOS 59 11/26/2014 1449   ALKPHOS 68 05/31/2011 1518   AST 18 11/26/2014 1449   AST 19 05/31/2011 1518   ALT 15 11/26/2014 1449   ALT 15 05/31/2011 1518   BILITOT 0.42 11/26/2014 1449   BILITOT 0.3 05/31/2011 1518       Lab Results  Component Value Date   WBC 7.3 11/26/2014   HGB 14.4 11/26/2014   HCT 43.1 11/26/2014   MCV 94.4 11/26/2014   PLT 287 11/26/2014   NEUTROABS 4.1 11/26/2014   ASSESSMENT & PLAN:  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 N0 M0 stage IA ER 94% PR 93% HER-2 negative Ki-67 5% status post lumpectomy and radiation therapy in 2007. To Arimidex in 2007 to 2012.  Surveillance: I reviewed the mammogram and ultrasound report as well as physical exam done today does not reveal any abnormalities. Recommended annual surveillance exams and mammograms. Survivorship: Discussed importance of exercise. She has polio the effects of right leg but otherwise she is staying active and stays  busy.  Patient decided to hold off on doing breast reconstruction after thinking about the pros and cons of it.  Return to clinic in 1 year for follow-up and after that she may elect to not follow-up with [place]. She definitely wanted to follow with [place] for 1 more year.  No orders of the defined types were placed in this encounter.   The patient has a good understanding of the overall plan. she agrees with it. she will call with any problems that may develop before the next visit here.   Rulon Eisenmenger, MD 11/26/2014

## 2014-11-26 NOTE — Telephone Encounter (Signed)
Appointments made and avs printed for patient °

## 2014-12-17 DIAGNOSIS — M25569 Pain in unspecified knee: Secondary | ICD-10-CM | POA: Diagnosis not present

## 2014-12-17 DIAGNOSIS — Z6835 Body mass index (BMI) 35.0-35.9, adult: Secondary | ICD-10-CM | POA: Diagnosis not present

## 2014-12-17 DIAGNOSIS — I10 Essential (primary) hypertension: Secondary | ICD-10-CM | POA: Diagnosis not present

## 2014-12-17 DIAGNOSIS — Z8612 Personal history of poliomyelitis: Secondary | ICD-10-CM | POA: Diagnosis not present

## 2014-12-17 DIAGNOSIS — Z0000 Encounter for general adult medical examination without abnormal findings: Secondary | ICD-10-CM | POA: Diagnosis not present

## 2014-12-17 DIAGNOSIS — Z853 Personal history of malignant neoplasm of breast: Secondary | ICD-10-CM | POA: Diagnosis not present

## 2014-12-17 DIAGNOSIS — E782 Mixed hyperlipidemia: Secondary | ICD-10-CM | POA: Diagnosis not present

## 2014-12-17 DIAGNOSIS — E669 Obesity, unspecified: Secondary | ICD-10-CM | POA: Diagnosis not present

## 2015-08-07 ENCOUNTER — Telehealth: Payer: Self-pay | Admitting: Hematology and Oncology

## 2015-08-07 ENCOUNTER — Other Ambulatory Visit: Payer: Self-pay

## 2015-08-07 NOTE — Progress Notes (Signed)
Received VM from pt questioning why 11/14 appt moved to 11/21.  I explained to pt that Dr. Lindi Adie was out of the office on 11/14 and thus her appt was rescheduled.  Pt requesting to be seen on 11/13 at 3:15pm.  POF entered and I informed pt she would be contacted by scheduling department once new appt was confirmed.  Pt without further questions or concerns.

## 2015-08-07 NOTE — Telephone Encounter (Signed)
Called patient to confirm appointment. Left voice message. Appointment letter and schedule mailed. Julie F. °

## 2015-08-10 ENCOUNTER — Other Ambulatory Visit: Payer: Self-pay | Admitting: Hematology and Oncology

## 2015-08-10 DIAGNOSIS — Z1231 Encounter for screening mammogram for malignant neoplasm of breast: Secondary | ICD-10-CM

## 2015-09-15 ENCOUNTER — Ambulatory Visit: Payer: Medicare Other

## 2015-09-29 ENCOUNTER — Ambulatory Visit
Admission: RE | Admit: 2015-09-29 | Discharge: 2015-09-29 | Disposition: A | Discharge status: Home / Self Care | Payer: Medicare Other | Source: Ambulatory Visit | Attending: Hematology and Oncology | Admitting: Hematology and Oncology

## 2015-09-29 DIAGNOSIS — Z1231 Encounter for screening mammogram for malignant neoplasm of breast: Secondary | ICD-10-CM | POA: Diagnosis not present

## 2015-10-23 DIAGNOSIS — Z23 Encounter for immunization: Secondary | ICD-10-CM | POA: Diagnosis not present

## 2015-11-30 ENCOUNTER — Other Ambulatory Visit: Payer: Medicare Other

## 2015-11-30 ENCOUNTER — Ambulatory Visit: Payer: Medicare Other | Admitting: Hematology and Oncology

## 2015-12-01 ENCOUNTER — Other Ambulatory Visit: Payer: Medicare Other

## 2015-12-01 ENCOUNTER — Ambulatory Visit: Payer: Medicare Other | Admitting: Hematology and Oncology

## 2015-12-08 ENCOUNTER — Other Ambulatory Visit: Payer: Medicare Other

## 2015-12-08 ENCOUNTER — Ambulatory Visit: Payer: Medicare Other | Admitting: Hematology and Oncology

## 2015-12-14 ENCOUNTER — Other Ambulatory Visit: Payer: Self-pay

## 2015-12-14 DIAGNOSIS — C50311 Malignant neoplasm of lower-inner quadrant of right female breast: Secondary | ICD-10-CM

## 2015-12-14 NOTE — Assessment & Plan Note (Signed)
Right breast invasive ductal carcinoma 0.9 cm mass T1 B. N0 M0 stage IA ER 94% PR 93% HER-2 negative Ki-67 5% status post lumpectomy and radiation therapy in 2007. To Arimidex in 2007 to 2012.  Surveillance: Mammogram 10/01/15: Benign Recommended annual surveillance exams and mammograms.  Survivorship: Discussed importance of exercise. She has polio the effects of right leg but otherwise she is staying active and stays busy.  Patient decided to hold off on doing breast reconstruction after thinking about the pros and cons of it.  Return to clinic in 1 year for follow-up and after that she may elect to not follow-up with [place]. She definitely wanted to follow with [place] for 1 more year.

## 2015-12-15 ENCOUNTER — Encounter: Payer: Self-pay | Admitting: Hematology and Oncology

## 2015-12-15 ENCOUNTER — Ambulatory Visit (HOSPITAL_BASED_OUTPATIENT_CLINIC_OR_DEPARTMENT_OTHER): Payer: Medicare Other | Admitting: Hematology and Oncology

## 2015-12-15 ENCOUNTER — Other Ambulatory Visit (HOSPITAL_BASED_OUTPATIENT_CLINIC_OR_DEPARTMENT_OTHER): Payer: Medicare Other

## 2015-12-15 DIAGNOSIS — Z79811 Long term (current) use of aromatase inhibitors: Secondary | ICD-10-CM | POA: Diagnosis not present

## 2015-12-15 DIAGNOSIS — Z170 Estrogen receptor positive status [ER+]: Secondary | ICD-10-CM

## 2015-12-15 DIAGNOSIS — C50311 Malignant neoplasm of lower-inner quadrant of right female breast: Secondary | ICD-10-CM | POA: Diagnosis not present

## 2015-12-15 LAB — CBC WITH DIFFERENTIAL/PLATELET
BASO%: 0.5 % (ref 0.0–2.0)
Basophils Absolute: 0 10*3/uL (ref 0.0–0.1)
EOS ABS: 0.2 10*3/uL (ref 0.0–0.5)
EOS%: 3.9 % (ref 0.0–7.0)
HCT: 42.1 % (ref 34.8–46.6)
HEMOGLOBIN: 14 g/dL (ref 11.6–15.9)
LYMPH#: 1.3 10*3/uL (ref 0.9–3.3)
LYMPH%: 21.3 % (ref 14.0–49.7)
MCH: 31.5 pg (ref 25.1–34.0)
MCHC: 33.3 g/dL (ref 31.5–36.0)
MCV: 94.8 fL (ref 79.5–101.0)
MONO#: 0.8 10*3/uL (ref 0.1–0.9)
MONO%: 13.6 % (ref 0.0–14.0)
NEUT%: 60.7 % (ref 38.4–76.8)
NEUTROS ABS: 3.7 10*3/uL (ref 1.5–6.5)
PLATELETS: 261 10*3/uL (ref 145–400)
RBC: 4.44 10*6/uL (ref 3.70–5.45)
RDW: 12.3 % (ref 11.2–14.5)
WBC: 6.1 10*3/uL (ref 3.9–10.3)

## 2015-12-15 LAB — COMPREHENSIVE METABOLIC PANEL
ALBUMIN: 3.5 g/dL (ref 3.5–5.0)
ALK PHOS: 63 U/L (ref 40–150)
ALT: 14 U/L (ref 0–55)
ANION GAP: 8 meq/L (ref 3–11)
AST: 17 U/L (ref 5–34)
BILIRUBIN TOTAL: 0.45 mg/dL (ref 0.20–1.20)
BUN: 10.3 mg/dL (ref 7.0–26.0)
CO2: 28 mEq/L (ref 22–29)
CREATININE: 0.8 mg/dL (ref 0.6–1.1)
Calcium: 10 mg/dL (ref 8.4–10.4)
Chloride: 108 mEq/L (ref 98–109)
EGFR: 76 mL/min/{1.73_m2} — AB (ref >90)
GLUCOSE: 100 mg/dL (ref 70–140)
Potassium: 3.7 mEq/L (ref 3.5–5.1)
Sodium: 144 mEq/L (ref 136–145)
TOTAL PROTEIN: 7.7 g/dL (ref 6.4–8.3)

## 2015-12-15 NOTE — Progress Notes (Signed)
Patient Care Team: Mayra Neer, MD as PCP - General (Family Medicine)  DIAGNOSIS:  Encounter Diagnosis  Name Primary?  . 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 N0 M0 stage IA diagnosed 2007ER 94%, PR 93%, HER-2/neu negative with a Ki-67 of 5%. She underwent an MRI that confirmed a solitary 1.1 cm mass       08/03/2005 Surgery    Rt Lumpectomy: grade 2, 0.9 cm invasive ductal carcinoma was removed with negative margins. Sentinel nodes were negative. No oncotype testing was done at the patients request.       09/12/2005 - 11/01/2005 Radiation Therapy    Adjuvant XRT      12/12/2005 - 12/13/2010 Anti-estrogen oral therapy    Arimidex  X 5 years       CHIEF COMPLIANT: Surveillance of breast cancer  INTERVAL HISTORY: Julie Ashley is a 70 year old with above-mentioned history of right breast cancer treated with lumpectomy and adjuvant radiation and finish 5 years of Arimidex therapy 2012. She was followed annually for surveillance with [place]. She has not had any problems or concerns. Her mammograms have been coming back normally. Her daughter Anderson Malta was also diagnosed with breast cancer and had undergone surgeries. There is no genetic mutation detected.  REVIEW OF SYSTEMS:   Constitutional: Denies fevers, chills or abnormal weight loss Eyes: Denies blurriness of vision Ears, nose, mouth, throat, and face: Denies mucositis or sore throat Respiratory: Denies cough, dyspnea or wheezes Cardiovascular: Denies palpitation, chest discomfort Gastrointestinal:  Denies nausea, heartburn or change in bowel habits Skin: Denies abnormal skin rashes Lymphatics: Denies new lymphadenopathy or easy bruising Neurological:Denies numbness, tingling or new  weaknesses Behavioral/Psych: Mood is stable, no new changes  Extremities: No lower extremity edema Breast:  denies any pain or lumps or nodules in either breasts All other systems were reviewed with the patient and are negative.  I have reviewed the past medical history, past surgical history, social history and family history with the patient and they are unchanged from previous note.  ALLERGIES:  is allergic to procaine hcl and sulfur.  MEDICATIONS:  Current Outpatient Prescriptions  Medication Sig Dispense Refill  . amitriptyline (ELAVIL) 10 MG tablet Take 10 mg by mouth at bedtime.      Marland Kitchen aspirin 81 MG tablet Take 81 mg by mouth daily.    Marland Kitchen atenolol (TENORMIN) 100 MG tablet Take 100 mg by mouth daily.      Marland Kitchen atorvastatin (LIPITOR) 10 MG tablet Take 10 mg by mouth daily.      . Calcium Carbonate-Vitamin D (CALTRATE 600+D PO) Take by mouth.      Marland Kitchen lisinopril (PRINIVIL,ZESTRIL) 20 MG tablet Take 20 mg by mouth daily.      . meloxicam (MOBIC) 15 MG tablet Take 7.5 mg by mouth every other day.     . Multiple Vitamins-Calcium (ONE-A-DAY WOMENS PO) Take by mouth.       No current facility-administered medications for this visit.     PHYSICAL EXAMINATION: ECOG PERFORMANCE STATUS: 1 - Symptomatic but completely ambulatory  Vitals:   12/15/15 1514  BP: (!) 146/76  Pulse: 83  Resp: 18  Temp: 98 F (36.7 C)   Filed Weights   12/15/15 1514  Weight: 184 lb 1.6 oz (83.5 kg)  GENERAL:alert, no distress and comfortable SKIN: skin color, texture, turgor are normal, no rashes or significant lesions EYES: normal, Conjunctiva are pink and non-injected, sclera clear OROPHARYNX:no exudate, no erythema and lips, buccal mucosa, and tongue normal  NECK: supple, thyroid normal size, non-tender, without nodularity LYMPH:  no palpable lymphadenopathy in the cervical, axillary or inguinal LUNGS: clear to auscultation and percussion with normal breathing effort HEART: regular rate & rhythm and  no murmurs and no lower extremity edema ABDOMEN:abdomen soft, non-tender and normal bowel sounds MUSCULOSKELETAL:no cyanosis of digits and no clubbing  NEURO: alert & oriented x 3 with fluent speech, no focal motor/sensory deficits EXTREMITIES: : polio involving the leg BREAST: No palpable masses or nodules in either right or left breasts. No palpable axillary supraclavicular or infraclavicular adenopathy no breast tenderness or nipple discharge. (exam performed in the presence of a chaperone)  LABORATORY DATA:  I have reviewed the data as listed   Chemistry      Component Value Date/Time   NA 144 12/15/2015 1449   K 3.7 12/15/2015 1449   CL 106 05/31/2011 1518   CO2 28 12/15/2015 1449   BUN 10.3 12/15/2015 1449   CREATININE 0.8 12/15/2015 1449      Component Value Date/Time   CALCIUM 10.0 12/15/2015 1449   ALKPHOS 63 12/15/2015 1449   AST 17 12/15/2015 1449   ALT 14 12/15/2015 1449   BILITOT 0.45 12/15/2015 1449       Lab Results  Component Value Date   WBC 6.1 12/15/2015   HGB 14.0 12/15/2015   HCT 42.1 12/15/2015   MCV 94.8 12/15/2015   PLT 261 12/15/2015   NEUTROABS 3.7 12/15/2015    ASSESSMENT & PLAN:  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 N0 M0 stage IA ER 94% PR 93% HER-2 negative Ki-67 5% status post lumpectomy and radiation therapy in 2007. To Arimidex in 2007 to 2012.  Surveillance: Mammogram 10/01/15: Benign Recommended annual surveillance exams and mammograms.  Survivorship: Discussed importance of exercise. She has polio the effects of right leg but otherwise she is staying active and stays busy.  Patient decided to hold off on doing breast reconstruction after thinking about the pros and cons of it.  Return to clinic On an as-needed basis   No orders of the defined types were placed in this encounter.  The patient has a good understanding of the overall plan. she agrees with  it. she will call with any problems that may develop before the next visit here.   Rulon Eisenmenger, MD 12/15/15

## 2015-12-23 DIAGNOSIS — Z0000 Encounter for general adult medical examination without abnormal findings: Secondary | ICD-10-CM | POA: Diagnosis not present

## 2015-12-23 DIAGNOSIS — E782 Mixed hyperlipidemia: Secondary | ICD-10-CM | POA: Diagnosis not present

## 2015-12-23 DIAGNOSIS — Z8612 Personal history of poliomyelitis: Secondary | ICD-10-CM | POA: Diagnosis not present

## 2015-12-23 DIAGNOSIS — I10 Essential (primary) hypertension: Secondary | ICD-10-CM | POA: Diagnosis not present

## 2015-12-23 DIAGNOSIS — E669 Obesity, unspecified: Secondary | ICD-10-CM | POA: Diagnosis not present

## 2015-12-23 DIAGNOSIS — G43909 Migraine, unspecified, not intractable, without status migrainosus: Secondary | ICD-10-CM | POA: Diagnosis not present

## 2015-12-23 DIAGNOSIS — Z853 Personal history of malignant neoplasm of breast: Secondary | ICD-10-CM | POA: Diagnosis not present

## 2016-08-26 ENCOUNTER — Other Ambulatory Visit: Payer: Self-pay | Admitting: Hematology and Oncology

## 2016-08-26 DIAGNOSIS — Z1231 Encounter for screening mammogram for malignant neoplasm of breast: Secondary | ICD-10-CM

## 2016-09-21 DIAGNOSIS — Z124 Encounter for screening for malignant neoplasm of cervix: Secondary | ICD-10-CM | POA: Diagnosis not present

## 2016-09-21 DIAGNOSIS — Z01419 Encounter for gynecological examination (general) (routine) without abnormal findings: Secondary | ICD-10-CM | POA: Diagnosis not present

## 2016-10-04 ENCOUNTER — Ambulatory Visit
Admission: RE | Admit: 2016-10-04 | Discharge: 2016-10-04 | Disposition: A | Discharge status: Home / Self Care | Payer: Medicare Other | Source: Ambulatory Visit | Attending: Hematology and Oncology | Admitting: Hematology and Oncology

## 2016-10-04 DIAGNOSIS — Z1231 Encounter for screening mammogram for malignant neoplasm of breast: Secondary | ICD-10-CM | POA: Diagnosis not present

## 2016-10-04 HISTORY — DX: Personal history of irradiation: Z92.3

## 2016-10-25 DIAGNOSIS — Z23 Encounter for immunization: Secondary | ICD-10-CM | POA: Diagnosis not present

## 2017-02-22 DIAGNOSIS — Z8612 Personal history of poliomyelitis: Secondary | ICD-10-CM | POA: Diagnosis not present

## 2017-02-22 DIAGNOSIS — Z853 Personal history of malignant neoplasm of breast: Secondary | ICD-10-CM | POA: Diagnosis not present

## 2017-02-22 DIAGNOSIS — E669 Obesity, unspecified: Secondary | ICD-10-CM | POA: Diagnosis not present

## 2017-02-22 DIAGNOSIS — Z1159 Encounter for screening for other viral diseases: Secondary | ICD-10-CM | POA: Diagnosis not present

## 2017-02-22 DIAGNOSIS — Z0000 Encounter for general adult medical examination without abnormal findings: Secondary | ICD-10-CM | POA: Diagnosis not present

## 2017-02-22 DIAGNOSIS — E782 Mixed hyperlipidemia: Secondary | ICD-10-CM | POA: Diagnosis not present

## 2017-02-22 DIAGNOSIS — Z6834 Body mass index (BMI) 34.0-34.9, adult: Secondary | ICD-10-CM | POA: Diagnosis not present

## 2017-02-22 DIAGNOSIS — I10 Essential (primary) hypertension: Secondary | ICD-10-CM | POA: Diagnosis not present

## 2017-02-22 DIAGNOSIS — G43909 Migraine, unspecified, not intractable, without status migrainosus: Secondary | ICD-10-CM | POA: Diagnosis not present

## 2017-09-20 ENCOUNTER — Other Ambulatory Visit: Payer: Self-pay | Admitting: Family Medicine

## 2017-09-20 DIAGNOSIS — Z1231 Encounter for screening mammogram for malignant neoplasm of breast: Secondary | ICD-10-CM

## 2017-09-27 DIAGNOSIS — Z6832 Body mass index (BMI) 32.0-32.9, adult: Secondary | ICD-10-CM | POA: Diagnosis not present

## 2017-09-27 DIAGNOSIS — Z124 Encounter for screening for malignant neoplasm of cervix: Secondary | ICD-10-CM | POA: Diagnosis not present

## 2017-09-28 DIAGNOSIS — Z124 Encounter for screening for malignant neoplasm of cervix: Secondary | ICD-10-CM | POA: Diagnosis not present

## 2017-10-04 DIAGNOSIS — Z23 Encounter for immunization: Secondary | ICD-10-CM | POA: Diagnosis not present

## 2017-11-07 ENCOUNTER — Ambulatory Visit
Admission: RE | Admit: 2017-11-07 | Discharge: 2017-11-07 | Disposition: A | Discharge status: Home / Self Care | Payer: Medicare Other | Source: Ambulatory Visit | Attending: Family Medicine | Admitting: Family Medicine

## 2017-11-07 DIAGNOSIS — Z1231 Encounter for screening mammogram for malignant neoplasm of breast: Secondary | ICD-10-CM

## 2017-11-09 ENCOUNTER — Other Ambulatory Visit: Payer: Self-pay | Admitting: Family Medicine

## 2017-11-09 DIAGNOSIS — R928 Other abnormal and inconclusive findings on diagnostic imaging of breast: Secondary | ICD-10-CM

## 2017-11-14 ENCOUNTER — Ambulatory Visit
Admission: RE | Admit: 2017-11-14 | Discharge: 2017-11-14 | Disposition: A | Discharge status: Home / Self Care | Payer: Medicare Other | Source: Ambulatory Visit | Attending: Family Medicine | Admitting: Family Medicine

## 2017-11-14 ENCOUNTER — Other Ambulatory Visit: Payer: Self-pay | Admitting: Family Medicine

## 2017-11-14 DIAGNOSIS — R928 Other abnormal and inconclusive findings on diagnostic imaging of breast: Secondary | ICD-10-CM

## 2017-11-14 DIAGNOSIS — N63 Unspecified lump in unspecified breast: Secondary | ICD-10-CM

## 2018-03-07 DIAGNOSIS — Z853 Personal history of malignant neoplasm of breast: Secondary | ICD-10-CM | POA: Diagnosis not present

## 2018-03-07 DIAGNOSIS — Z8612 Personal history of poliomyelitis: Secondary | ICD-10-CM | POA: Diagnosis not present

## 2018-03-07 DIAGNOSIS — G43909 Migraine, unspecified, not intractable, without status migrainosus: Secondary | ICD-10-CM | POA: Diagnosis not present

## 2018-03-07 DIAGNOSIS — E782 Mixed hyperlipidemia: Secondary | ICD-10-CM | POA: Diagnosis not present

## 2018-03-07 DIAGNOSIS — Z0000 Encounter for general adult medical examination without abnormal findings: Secondary | ICD-10-CM | POA: Diagnosis not present

## 2018-03-07 DIAGNOSIS — Z6833 Body mass index (BMI) 33.0-33.9, adult: Secondary | ICD-10-CM | POA: Diagnosis not present

## 2018-03-07 DIAGNOSIS — I10 Essential (primary) hypertension: Secondary | ICD-10-CM | POA: Diagnosis not present

## 2018-03-07 DIAGNOSIS — E669 Obesity, unspecified: Secondary | ICD-10-CM | POA: Diagnosis not present

## 2018-03-13 ENCOUNTER — Other Ambulatory Visit: Payer: Self-pay | Admitting: Family Medicine

## 2018-03-13 DIAGNOSIS — Z13820 Encounter for screening for osteoporosis: Secondary | ICD-10-CM

## 2018-05-15 ENCOUNTER — Other Ambulatory Visit: Payer: Medicare Other

## 2018-05-16 ENCOUNTER — Other Ambulatory Visit: Payer: Medicare Other

## 2018-05-22 ENCOUNTER — Other Ambulatory Visit: Payer: Medicare Other

## 2018-07-04 ENCOUNTER — Ambulatory Visit
Admission: RE | Admit: 2018-07-04 | Discharge: 2018-07-04 | Disposition: A | Discharge status: Home / Self Care | Payer: Medicare Other | Source: Ambulatory Visit | Attending: Family Medicine | Admitting: Family Medicine

## 2018-07-04 ENCOUNTER — Other Ambulatory Visit: Payer: Self-pay | Admitting: Family Medicine

## 2018-07-04 ENCOUNTER — Other Ambulatory Visit: Payer: Self-pay

## 2018-07-04 DIAGNOSIS — N6002 Solitary cyst of left breast: Secondary | ICD-10-CM | POA: Diagnosis not present

## 2018-07-04 DIAGNOSIS — N6320 Unspecified lump in the left breast, unspecified quadrant: Secondary | ICD-10-CM

## 2018-07-04 DIAGNOSIS — R928 Other abnormal and inconclusive findings on diagnostic imaging of breast: Secondary | ICD-10-CM | POA: Diagnosis not present

## 2018-07-04 DIAGNOSIS — Z13820 Encounter for screening for osteoporosis: Secondary | ICD-10-CM | POA: Diagnosis not present

## 2018-07-04 DIAGNOSIS — N63 Unspecified lump in unspecified breast: Secondary | ICD-10-CM

## 2018-07-04 DIAGNOSIS — N6321 Unspecified lump in the left breast, upper outer quadrant: Secondary | ICD-10-CM | POA: Diagnosis not present

## 2018-07-04 DIAGNOSIS — Z780 Asymptomatic menopausal state: Secondary | ICD-10-CM | POA: Diagnosis not present

## 2018-09-29 DIAGNOSIS — Z23 Encounter for immunization: Secondary | ICD-10-CM | POA: Diagnosis not present

## 2018-10-03 DIAGNOSIS — Z6832 Body mass index (BMI) 32.0-32.9, adult: Secondary | ICD-10-CM | POA: Diagnosis not present

## 2018-10-03 DIAGNOSIS — Z01419 Encounter for gynecological examination (general) (routine) without abnormal findings: Secondary | ICD-10-CM | POA: Diagnosis not present

## 2018-10-03 DIAGNOSIS — Z124 Encounter for screening for malignant neoplasm of cervix: Secondary | ICD-10-CM | POA: Diagnosis not present

## 2018-10-04 DIAGNOSIS — Z124 Encounter for screening for malignant neoplasm of cervix: Secondary | ICD-10-CM | POA: Diagnosis not present

## 2018-11-09 ENCOUNTER — Other Ambulatory Visit: Payer: Medicare Other

## 2018-11-27 ENCOUNTER — Ambulatory Visit
Admission: RE | Admit: 2018-11-27 | Discharge: 2018-11-27 | Disposition: A | Discharge status: Home / Self Care | Payer: Medicare Other | Source: Ambulatory Visit | Attending: Family Medicine | Admitting: Family Medicine

## 2018-11-27 ENCOUNTER — Other Ambulatory Visit: Payer: Self-pay

## 2018-11-27 DIAGNOSIS — R928 Other abnormal and inconclusive findings on diagnostic imaging of breast: Secondary | ICD-10-CM | POA: Diagnosis not present

## 2018-11-27 DIAGNOSIS — N6322 Unspecified lump in the left breast, upper inner quadrant: Secondary | ICD-10-CM | POA: Diagnosis not present

## 2018-11-27 DIAGNOSIS — N6320 Unspecified lump in the left breast, unspecified quadrant: Secondary | ICD-10-CM

## 2018-11-27 DIAGNOSIS — N63 Unspecified lump in unspecified breast: Secondary | ICD-10-CM

## 2019-03-13 DIAGNOSIS — I10 Essential (primary) hypertension: Secondary | ICD-10-CM | POA: Diagnosis not present

## 2019-03-13 DIAGNOSIS — Z0000 Encounter for general adult medical examination without abnormal findings: Secondary | ICD-10-CM | POA: Diagnosis not present

## 2019-03-13 DIAGNOSIS — E782 Mixed hyperlipidemia: Secondary | ICD-10-CM | POA: Diagnosis not present

## 2019-03-13 DIAGNOSIS — Z853 Personal history of malignant neoplasm of breast: Secondary | ICD-10-CM | POA: Diagnosis not present

## 2019-03-13 DIAGNOSIS — Z8612 Personal history of poliomyelitis: Secondary | ICD-10-CM | POA: Diagnosis not present

## 2019-03-13 DIAGNOSIS — G43909 Migraine, unspecified, not intractable, without status migrainosus: Secondary | ICD-10-CM | POA: Diagnosis not present

## 2019-03-13 DIAGNOSIS — E669 Obesity, unspecified: Secondary | ICD-10-CM | POA: Diagnosis not present

## 2019-04-30 DIAGNOSIS — Z1159 Encounter for screening for other viral diseases: Secondary | ICD-10-CM | POA: Diagnosis not present

## 2019-05-03 DIAGNOSIS — K635 Polyp of colon: Secondary | ICD-10-CM | POA: Diagnosis not present

## 2019-05-03 DIAGNOSIS — Z86010 Personal history of colonic polyps: Secondary | ICD-10-CM | POA: Diagnosis not present

## 2019-05-07 DIAGNOSIS — K635 Polyp of colon: Secondary | ICD-10-CM | POA: Diagnosis not present

## 2019-10-26 DIAGNOSIS — Z23 Encounter for immunization: Secondary | ICD-10-CM | POA: Diagnosis not present

## 2019-11-07 ENCOUNTER — Other Ambulatory Visit: Payer: Self-pay | Admitting: Family Medicine

## 2019-11-07 DIAGNOSIS — N63 Unspecified lump in unspecified breast: Secondary | ICD-10-CM

## 2020-01-01 ENCOUNTER — Other Ambulatory Visit: Payer: Medicare Other

## 2020-01-28 ENCOUNTER — Other Ambulatory Visit: Payer: Self-pay

## 2020-01-28 ENCOUNTER — Ambulatory Visit
Admission: RE | Admit: 2020-01-28 | Discharge: 2020-01-28 | Disposition: A | Discharge status: Home / Self Care | Payer: Medicare Other | Source: Ambulatory Visit | Attending: Family Medicine | Admitting: Family Medicine

## 2020-01-28 DIAGNOSIS — N63 Unspecified lump in unspecified breast: Secondary | ICD-10-CM

## 2020-01-28 DIAGNOSIS — N6322 Unspecified lump in the left breast, upper inner quadrant: Secondary | ICD-10-CM | POA: Diagnosis not present

## 2020-01-28 DIAGNOSIS — R928 Other abnormal and inconclusive findings on diagnostic imaging of breast: Secondary | ICD-10-CM | POA: Diagnosis not present

## 2020-03-18 DIAGNOSIS — I10 Essential (primary) hypertension: Secondary | ICD-10-CM | POA: Diagnosis not present

## 2020-03-18 DIAGNOSIS — E782 Mixed hyperlipidemia: Secondary | ICD-10-CM | POA: Diagnosis not present

## 2020-03-18 DIAGNOSIS — Z8612 Personal history of poliomyelitis: Secondary | ICD-10-CM | POA: Diagnosis not present

## 2020-03-18 DIAGNOSIS — Z0000 Encounter for general adult medical examination without abnormal findings: Secondary | ICD-10-CM | POA: Diagnosis not present

## 2020-03-18 DIAGNOSIS — G43909 Migraine, unspecified, not intractable, without status migrainosus: Secondary | ICD-10-CM | POA: Diagnosis not present

## 2020-03-18 DIAGNOSIS — Z853 Personal history of malignant neoplasm of breast: Secondary | ICD-10-CM | POA: Diagnosis not present

## 2020-03-18 DIAGNOSIS — E669 Obesity, unspecified: Secondary | ICD-10-CM | POA: Diagnosis not present

## 2020-07-09 DIAGNOSIS — J40 Bronchitis, not specified as acute or chronic: Secondary | ICD-10-CM | POA: Diagnosis not present

## 2020-07-09 DIAGNOSIS — Z20822 Contact with and (suspected) exposure to covid-19: Secondary | ICD-10-CM | POA: Diagnosis not present

## 2020-07-09 DIAGNOSIS — R0981 Nasal congestion: Secondary | ICD-10-CM | POA: Diagnosis not present

## 2020-09-08 IMAGING — MG DIGITAL SCREENING BILATERAL MAMMOGRAM WITH TOMO AND CAD
6 of 10 series · 6 of 30 positions shown · non-contrast
Comparison: Previous exam(s).

CLINICAL DATA: Screening.

EXAM:
DIGITAL SCREENING BILATERAL MAMMOGRAM WITH TOMO AND CAD

[L CC synth-2D]
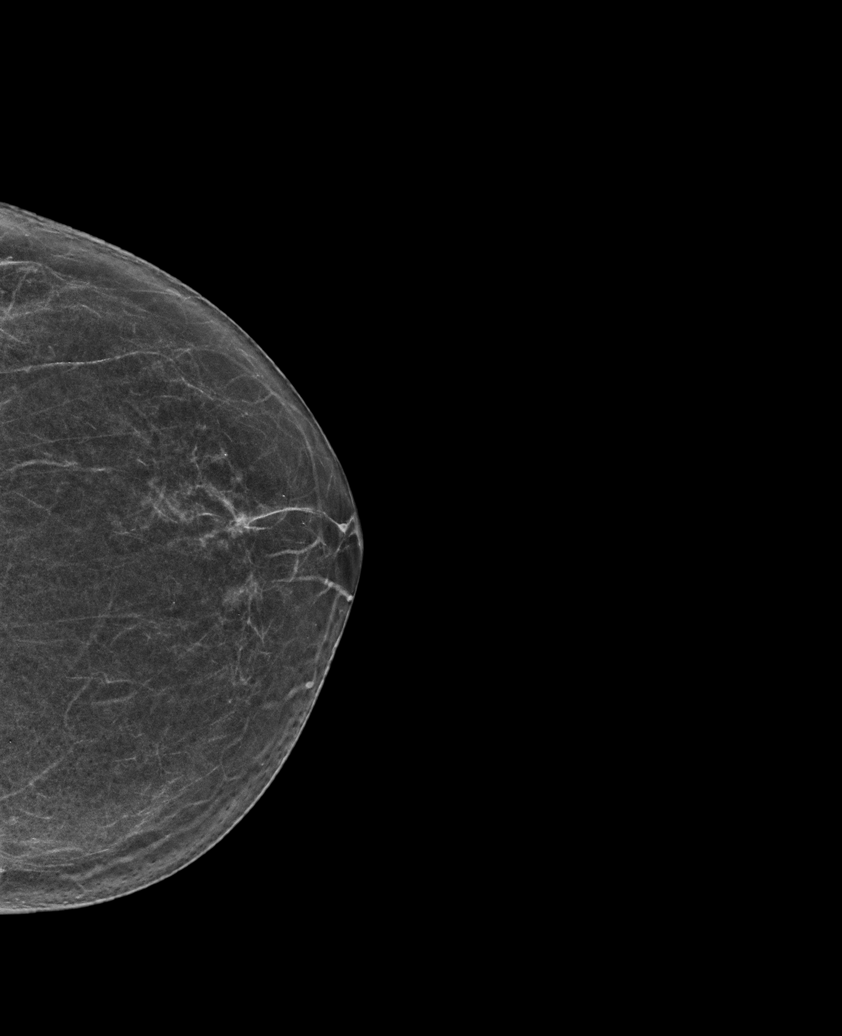

[R MLO synth-2D (1 of 2)]
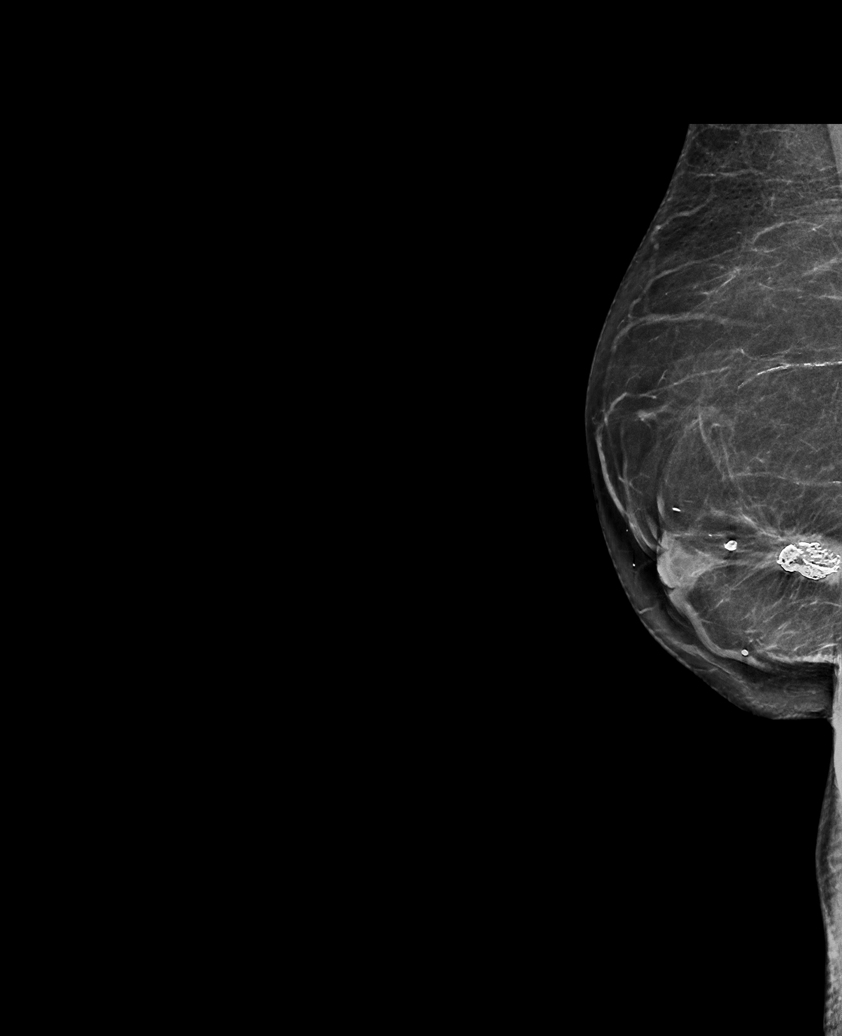

[L MLO synth-2D]
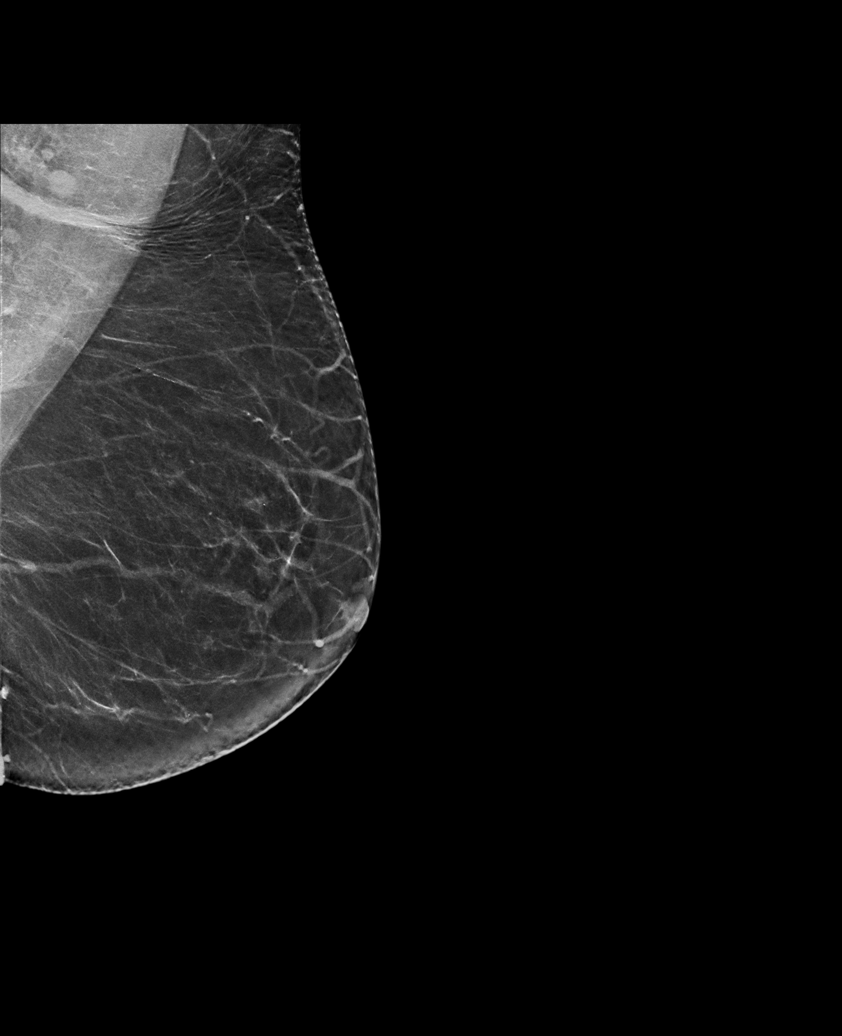

[R MLO synth-2D (2 of 2)]
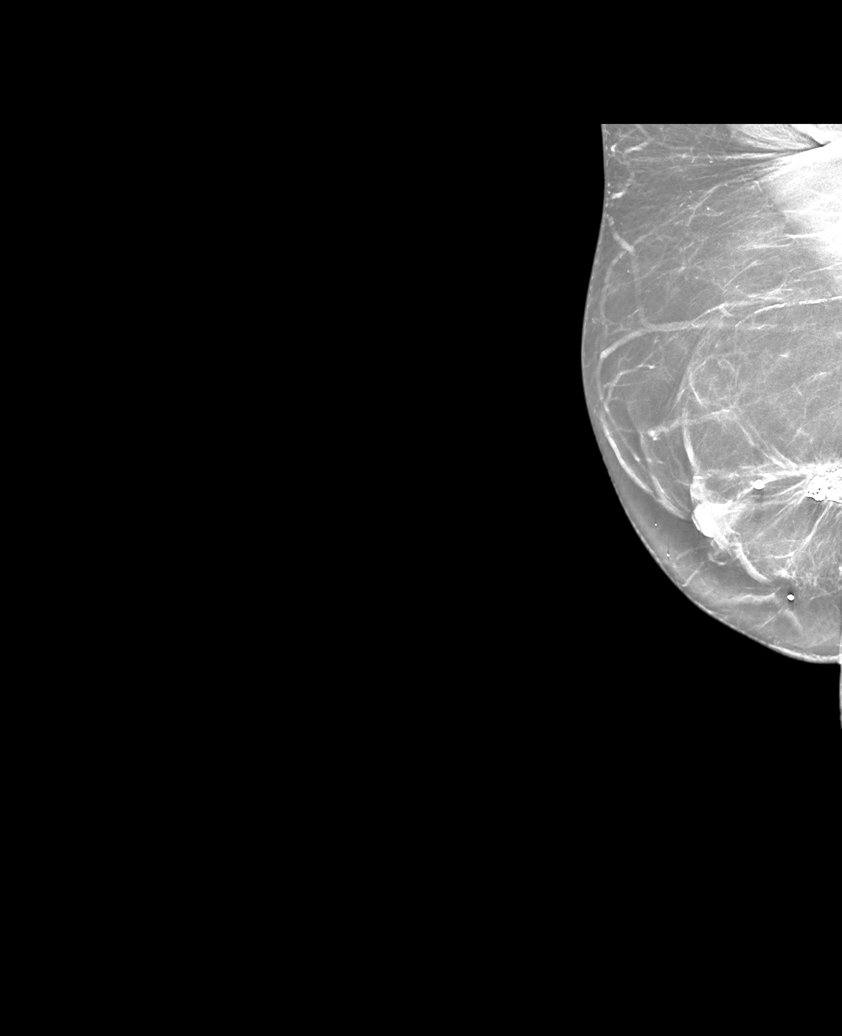

[R CC synth-2D]
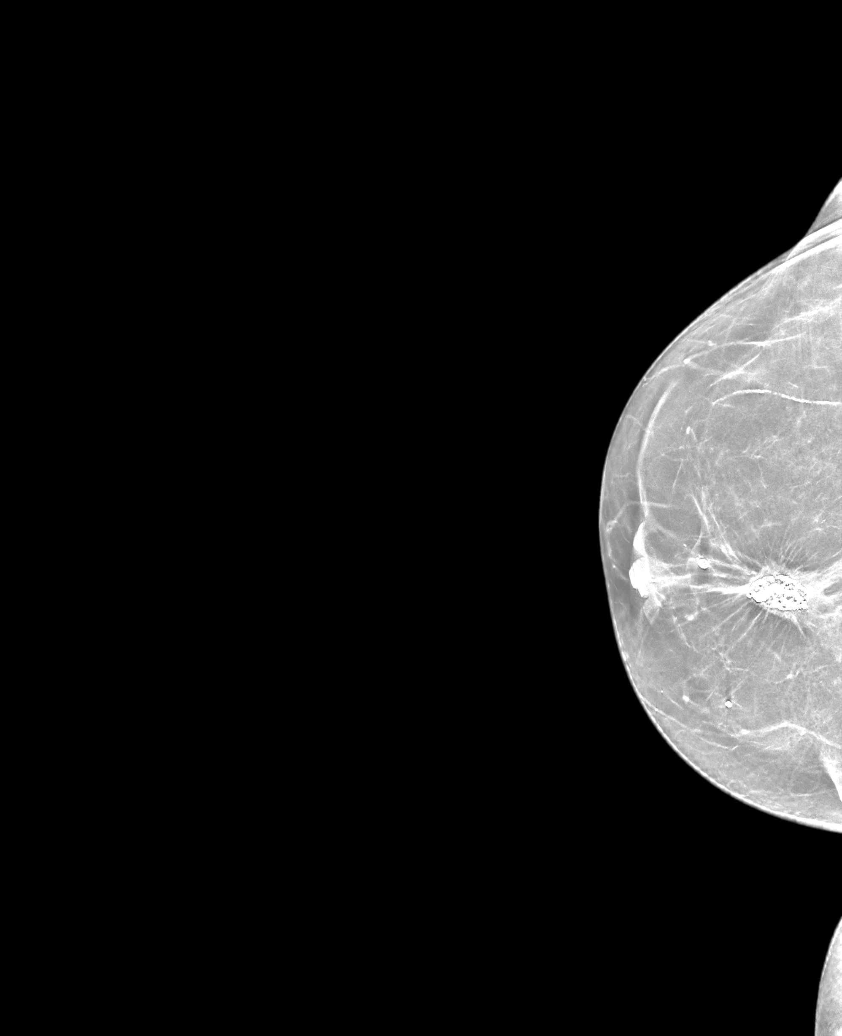

[L CC tomo · tomo slice 29/57.0]
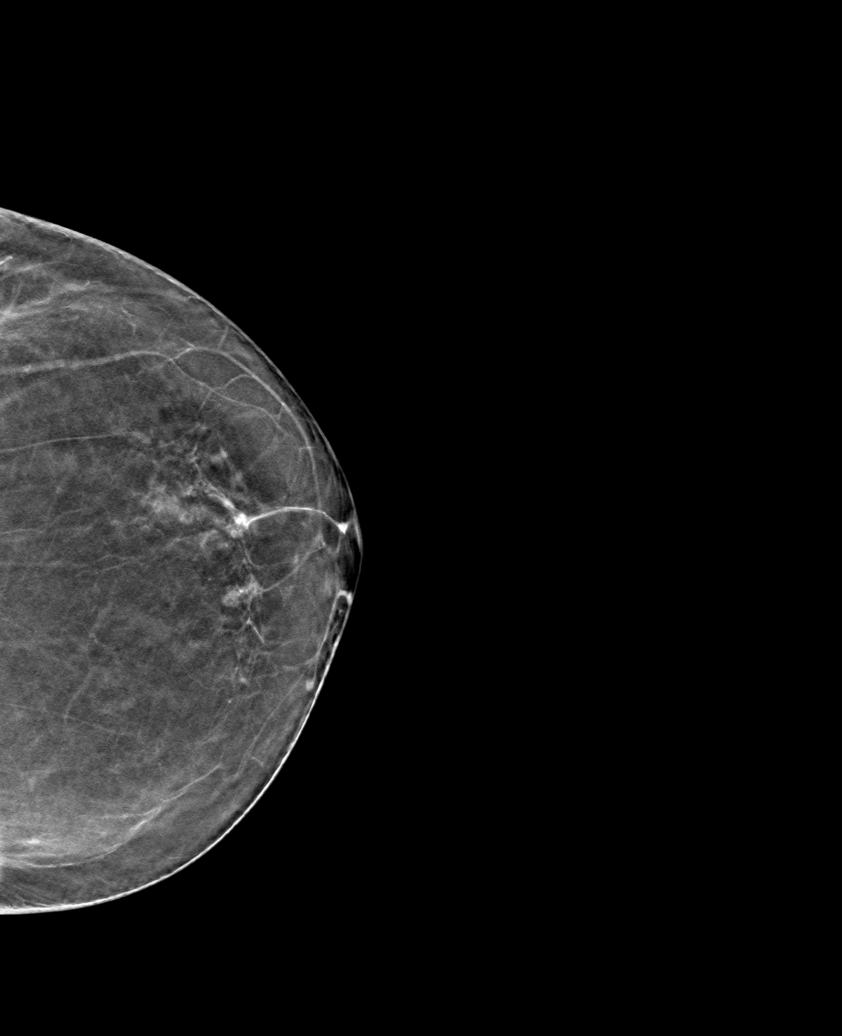

[6 of 30 positions shown; findings below may reference images not displayed]

ACR Breast Density Category b: There are scattered areas of
fibroglandular density.
FINDINGS: In the left breast, a possible mass warrants further evaluation.
This possible mass is seen within the slightly inner LEFT breast,
anterior to middle depth, tomosynthesis CC slice 27, possible
correlate within the retroareolar LEFT breast on MLO slice 39.

In the right breast, no findings suspicious for malignancy.

Images were processed with CAD.
IMPRESSION: Further evaluation is suggested for possible mass in the left
breast.

RECOMMENDATION:
Diagnostic mammogram and possibly ultrasound of the left breast.
(Code:[id number]-[id number])

The patient will be contacted regarding the findings, and additional
imaging will be scheduled.

BI-RADS CATEGORY  0: Incomplete. Need additional imaging evaluation
and/or prior mammograms for comparison.

## 2020-10-03 DIAGNOSIS — Z23 Encounter for immunization: Secondary | ICD-10-CM | POA: Diagnosis not present

## 2020-10-14 DIAGNOSIS — Z6832 Body mass index (BMI) 32.0-32.9, adult: Secondary | ICD-10-CM | POA: Diagnosis not present

## 2020-10-14 DIAGNOSIS — Z01419 Encounter for gynecological examination (general) (routine) without abnormal findings: Secondary | ICD-10-CM | POA: Diagnosis not present

## 2020-10-14 DIAGNOSIS — Z124 Encounter for screening for malignant neoplasm of cervix: Secondary | ICD-10-CM | POA: Diagnosis not present

## 2021-01-28 ENCOUNTER — Other Ambulatory Visit: Payer: Self-pay | Admitting: Family Medicine

## 2021-01-28 DIAGNOSIS — Z1231 Encounter for screening mammogram for malignant neoplasm of breast: Secondary | ICD-10-CM

## 2021-02-12 DIAGNOSIS — U071 COVID-19: Secondary | ICD-10-CM | POA: Diagnosis not present

## 2021-02-23 ENCOUNTER — Ambulatory Visit: Payer: Medicare Other

## 2021-03-03 ENCOUNTER — Other Ambulatory Visit: Payer: Self-pay

## 2021-03-03 ENCOUNTER — Ambulatory Visit
Admission: RE | Admit: 2021-03-03 | Discharge: 2021-03-03 | Disposition: A | Discharge status: Home / Self Care | Payer: Medicare Other | Source: Ambulatory Visit | Attending: Family Medicine | Admitting: Family Medicine

## 2021-03-03 DIAGNOSIS — Z1231 Encounter for screening mammogram for malignant neoplasm of breast: Secondary | ICD-10-CM | POA: Diagnosis not present

## 2021-03-24 DIAGNOSIS — Z8612 Personal history of poliomyelitis: Secondary | ICD-10-CM | POA: Diagnosis not present

## 2021-03-24 DIAGNOSIS — E782 Mixed hyperlipidemia: Secondary | ICD-10-CM | POA: Diagnosis not present

## 2021-03-24 DIAGNOSIS — G43909 Migraine, unspecified, not intractable, without status migrainosus: Secondary | ICD-10-CM | POA: Diagnosis not present

## 2021-03-24 DIAGNOSIS — Z0000 Encounter for general adult medical examination without abnormal findings: Secondary | ICD-10-CM | POA: Diagnosis not present

## 2021-03-24 DIAGNOSIS — I10 Essential (primary) hypertension: Secondary | ICD-10-CM | POA: Diagnosis not present

## 2021-03-24 DIAGNOSIS — Z853 Personal history of malignant neoplasm of breast: Secondary | ICD-10-CM | POA: Diagnosis not present

## 2021-03-24 DIAGNOSIS — E669 Obesity, unspecified: Secondary | ICD-10-CM | POA: Diagnosis not present

## 2021-10-16 DIAGNOSIS — Z23 Encounter for immunization: Secondary | ICD-10-CM | POA: Diagnosis not present

## 2022-03-22 ENCOUNTER — Other Ambulatory Visit: Payer: Self-pay | Admitting: Family Medicine

## 2022-03-22 DIAGNOSIS — Z1231 Encounter for screening mammogram for malignant neoplasm of breast: Secondary | ICD-10-CM

## 2022-03-30 DIAGNOSIS — Z0000 Encounter for general adult medical examination without abnormal findings: Secondary | ICD-10-CM | POA: Diagnosis not present

## 2022-03-30 DIAGNOSIS — E669 Obesity, unspecified: Secondary | ICD-10-CM | POA: Diagnosis not present

## 2022-03-30 DIAGNOSIS — E782 Mixed hyperlipidemia: Secondary | ICD-10-CM | POA: Diagnosis not present

## 2022-03-30 DIAGNOSIS — Z8612 Personal history of poliomyelitis: Secondary | ICD-10-CM | POA: Diagnosis not present

## 2022-03-30 DIAGNOSIS — G43909 Migraine, unspecified, not intractable, without status migrainosus: Secondary | ICD-10-CM | POA: Diagnosis not present

## 2022-03-30 DIAGNOSIS — I10 Essential (primary) hypertension: Secondary | ICD-10-CM | POA: Diagnosis not present

## 2022-03-30 DIAGNOSIS — Z853 Personal history of malignant neoplasm of breast: Secondary | ICD-10-CM | POA: Diagnosis not present

## 2022-05-11 ENCOUNTER — Ambulatory Visit
Admission: RE | Admit: 2022-05-11 | Discharge: 2022-05-11 | Disposition: A | Discharge status: Home / Self Care | Payer: Medicare Other | Source: Ambulatory Visit | Attending: Family Medicine | Admitting: Family Medicine

## 2022-05-11 DIAGNOSIS — Z1231 Encounter for screening mammogram for malignant neoplasm of breast: Secondary | ICD-10-CM

## 2022-10-29 DIAGNOSIS — Z23 Encounter for immunization: Secondary | ICD-10-CM | POA: Diagnosis not present

## 2023-04-05 DIAGNOSIS — Z0000 Encounter for general adult medical examination without abnormal findings: Secondary | ICD-10-CM | POA: Diagnosis not present

## 2023-04-05 DIAGNOSIS — Z131 Encounter for screening for diabetes mellitus: Secondary | ICD-10-CM | POA: Diagnosis not present

## 2023-04-05 DIAGNOSIS — Z23 Encounter for immunization: Secondary | ICD-10-CM | POA: Diagnosis not present

## 2023-04-05 DIAGNOSIS — Z8612 Personal history of poliomyelitis: Secondary | ICD-10-CM | POA: Diagnosis not present

## 2023-04-05 DIAGNOSIS — I10 Essential (primary) hypertension: Secondary | ICD-10-CM | POA: Diagnosis not present

## 2023-04-05 DIAGNOSIS — E782 Mixed hyperlipidemia: Secondary | ICD-10-CM | POA: Diagnosis not present

## 2023-04-05 DIAGNOSIS — E669 Obesity, unspecified: Secondary | ICD-10-CM | POA: Diagnosis not present

## 2023-04-05 DIAGNOSIS — Z853 Personal history of malignant neoplasm of breast: Secondary | ICD-10-CM | POA: Diagnosis not present

## 2023-04-06 ENCOUNTER — Other Ambulatory Visit: Payer: Self-pay | Admitting: Family Medicine

## 2023-04-06 DIAGNOSIS — E2839 Other primary ovarian failure: Secondary | ICD-10-CM

## 2023-05-01 ENCOUNTER — Other Ambulatory Visit: Payer: Self-pay | Admitting: Family Medicine

## 2023-05-01 DIAGNOSIS — Z1231 Encounter for screening mammogram for malignant neoplasm of breast: Secondary | ICD-10-CM

## 2023-05-24 ENCOUNTER — Ambulatory Visit
Admission: RE | Admit: 2023-05-24 | Discharge: 2023-05-24 | Disposition: A | Discharge status: Home / Self Care | Source: Ambulatory Visit | Attending: Family Medicine | Admitting: Family Medicine

## 2023-05-24 DIAGNOSIS — Z1231 Encounter for screening mammogram for malignant neoplasm of breast: Secondary | ICD-10-CM | POA: Diagnosis not present

## 2023-11-04 DIAGNOSIS — Z23 Encounter for immunization: Secondary | ICD-10-CM | POA: Diagnosis not present

## 2024-01-16 ENCOUNTER — Other Ambulatory Visit

## 2024-03-04 ENCOUNTER — Other Ambulatory Visit
